# Patient Record
Sex: Male | Born: 1945 | Race: White | Hispanic: No | Marital: Married
Health system: Midwestern US, Community
[De-identification: ages and names within clinical notes are randomized; demographics above are authoritative.]

## PROBLEM LIST (undated history)

## (undated) DIAGNOSIS — I10 Essential (primary) hypertension: Secondary | ICD-10-CM

## (undated) DIAGNOSIS — E7800 Pure hypercholesterolemia, unspecified: Secondary | ICD-10-CM

## (undated) DIAGNOSIS — S43422A Sprain of left rotator cuff capsule, initial encounter: Secondary | ICD-10-CM

## (undated) DIAGNOSIS — R7303 Prediabetes: Secondary | ICD-10-CM

## (undated) DIAGNOSIS — R002 Palpitations: Secondary | ICD-10-CM

## (undated) DIAGNOSIS — Z125 Encounter for screening for malignant neoplasm of prostate: Secondary | ICD-10-CM

---

## 2008-11-10 LAB — LIPID PANEL
Cholesterol, Total: 175 mg/dl (ref <200)
HDL: 37 mg/dL — ABNORMAL LOW (ref 40–60)
LDL Calculated: 113 mg/dl — ABNORMAL HIGH (ref <100)
Triglycerides: 124 mg/dl (ref <150)
VLDL Cholesterol Calculated: 25 mg/dl

## 2008-11-10 LAB — CBC WITH AUTO DIFFERENTIAL
Basophils %: 0.5 % (ref 0.0–2.0)
Basophils Absolute: 0 10*3 (ref 0.0–0.2)
Eosinophils %: 4.8 % (ref 0.0–5.0)
Eosinophils Absolute: 0.3 10*3 (ref 0.0–0.6)
Granulocyte Absolute Count: 3.2 10*3 (ref 1.7–7.7)
Hematocrit: 44.9 % (ref 40.5–52.5)
Hemoglobin: 15 g/dL (ref 13.5–17.5)
Lymphocytes %: 30.2 % (ref 25.0–40.0)
Lymphocytes Absolute: 1.8 10*3 (ref 1.0–5.1)
MCH: 33.2 pg (ref 26–34)
MCHC: 33.4 g/dL (ref 31–36)
MCV: 99.4 fl (ref 80–100)
MPV: 8.4 fl (ref 5.0–10.5)
Monocytes %: 9.3 %
Monocytes Absolute: 0.5 10*3
Platelets: 221 10*3 (ref 135–450)
RBC: 4.52 10*6 (ref 4.2–5.9)
RDW: 13.2 % (ref 11.5–14.5)
Segs Relative: 55.2 % (ref 42.0–63.0)
WBC: 5.8 10*3 (ref 4.0–11.0)

## 2008-11-10 LAB — PSA SCREENING: PSA: 1.39 ng/ml

## 2008-11-10 LAB — RENAL FUNCTION PANEL
Albumin: 4.3 g/dL (ref 3.4–5.0)
BUN: 14 mg/dL (ref 7–18)
CO2: 26 meq/L (ref 21–32)
Calcium: 9.2 mg/dL (ref 8.3–10.6)
Chloride: 105 meq/L (ref 99–110)
Creatinine: 1 mg/dL (ref 0.8–1.3)
GFR Est, African/Amer: >60
GFR, Estimated: >60 (ref >60)
Glucose: 93 mg/dL (ref 70–99)
Phosphorus: 4.1 mg/dL (ref 2.5–4.9)
Potassium: 4.2 meq/L (ref 3.5–5.1)
Sodium: 142 meq/L (ref 136–145)

## 2008-11-10 LAB — AST: AST: 32 U/L (ref 15–37)

## 2008-12-07 MED ORDER — VARDENAFIL HCL 20 MG PO TABS
20 | ORAL_TABLET | ORAL | Status: DC | PRN
Start: 2008-12-07 — End: 2009-12-20

## 2008-12-07 NOTE — Progress Notes (Addendum)
Addended by: Lurlean Leyden on: 12/07/2008      Modules accepted: Orders

## 2008-12-07 NOTE — Progress Notes (Signed)
Subjective:      Patient ID: Brandon Powell is a 63 y.o. male.    HPI The patient is here for comprehensive evaluation of ongoing medical problems. The patient has been instructed in the benefits of a healthy diet, regular exercise and ideal body weight.   The patient is here for ongoing evaluation and treatment of hypertension. The patient has been instructed in the desirability regular exercise, ideal body weight and medication compliance. The patient has been advised to perform regular home blood pressure monitoring and to bring the results to every office visit.   The patient is here for evaluation of dyslipidemia. The patient has been instructed in diet and exercise.     Review of Systems   Constitutional: Negative.  Negative for appetite change, fatigue and unexpected weight change.   HENT: Negative.  Negative for hearing loss, ear pain, congestion, trouble swallowing, neck pain, neck stiffness and tinnitus.         "My palate and deviated septum" has been treated by the [facility] with an allergy pill (? Name) and a spray (?name). He still uses the pill.   Eyes: Negative.  Negative for visual disturbance.   Respiratory: Negative.  Negative for cough, chest tightness, shortness of breath and wheezing.    Cardiovascular: Negative.  Negative for chest pain, palpitations and leg swelling.   Gastrointestinal: Negative.  Negative for nausea, abdominal pain, diarrhea, constipation, blood in stool, anal bleeding and rectal pain.        Recurrent symptoms of dysphagia. He has a known Schatzki's ring per Dr. Roselle Locus in the past.   Genitourinary: Negative.  Negative for dysuria, urgency, frequency, hematuria and difficulty urinating.   Musculoskeletal: Positive for arthralgias. Negative for myalgias, back pain, joint swelling and gait problem.        Shoulders still hurt, he is not yet ready to do anything surgical.   Skin: Negative.  Negative for rash, color change and pallor.   Neurological: Negative.  Negative for  dizziness, syncope, speech difficulty, weakness, light-headedness, numbness and headaches.   Hematological: Negative.  Does not bruise/bleed easily.   Psychiatric/Behavioral: Negative.  Negative for suicidal ideas, confusion, sleep disturbance, self-injury, dysphoric mood and decreased concentration. The patient is not nervous/anxious and is not hyperactive.    All other systems reviewed and are negative.        Objective:   Physical Exam   Nursing note and vitals reviewed.  Constitutional: He is oriented to person, place, and time. He appears well-developed and well-nourished. No distress.   HENT:   Head: Normocephalic and atraumatic.   Right Ear: External ear normal.   Left Ear: External ear normal.   Nose: Nose normal.   Mouth/Throat: Oropharynx is clear and moist. No oropharyngeal exudate.   Eyes: Conjunctivae and extraocular motions are normal. Pupils are equal, round, and reactive to light. Right eye exhibits no discharge. Left eye exhibits no discharge. No scleral icterus.   Neck: Normal range of motion. Neck supple. No JVD present. No tracheal deviation present. No thyromegaly present.   Cardiovascular: Regular rhythm and intact distal pulses.  Exam reveals no gallop and no friction rub.    No murmur heard.       No carotid bruits.   Pulmonary/Chest: Effort normal and breath sounds normal. No stridor. No respiratory distress. He has no wheezes. He has no rales. He exhibits no tenderness.   Abdominal: Soft. Bowel sounds are normal. He exhibits no distension and no mass. No tenderness. He has  no rebound and no guarding.   Genitourinary: Rectum normal, prostate normal and penis normal. Guaiac negative stool.   Musculoskeletal: Normal range of motion. He exhibits no edema and no tenderness.   Lymphadenopathy:     He has no cervical adenopathy.   Neurological: He is alert and oriented to person, place, and time. No cranial nerve deficit. Coordination normal.   Skin: Skin is warm and dry. No rash noted. He is not  diaphoretic. No erythema. No pallor.   Psychiatric: He has a normal mood and affect. His behavior is normal. Judgment and thought content normal.       Assessment:      Patient Active Problem List   Diagnoses Date Noted   . Prediabetes [790.48F] 11/15/2008   . Dysphagia [787.20S] 11/15/2008   . ED (erectile dysfunction) [607.84R] 11/15/2008   . GERD (gastroesophageal reflux disease) [530.81S]    . HYPERTENSION [401.9AJ]    . HYPERCHOLESTEROLEMIA [272.0L]         Stable except for increasing dysphagia. No sign of pre-diabetes on this exam.      Plan:      See Dr. Roselle Locus [phone] for an esophageal dilitation for your Schatzki's ring.  Take Ciprofloxacin 500mg  twice a day for at least 5 days for diarrhea.  Ret in 6 mos for fasting blood only.

## 2008-12-07 NOTE — Patient Instructions (Signed)
See Dr. Roselle Locus [phone] for an esophageal dilitation for your Schatzki's ring.  Take Ciprofloxacin 500mg  twice a day for at least 5 days for diarrhea.

## 2009-06-06 ENCOUNTER — Encounter

## 2009-06-07 LAB — LIPID PANEL
Cholesterol, Total: 182 mg/dl (ref <200)
HDL: 35 mg/dl — ABNORMAL LOW (ref 40–60)
LDL Direct: 130 mg/dl — ABNORMAL HIGH (ref <100)
Triglycerides: 308 mg/dl — ABNORMAL HIGH (ref <150)

## 2009-06-07 LAB — BASIC METABOLIC PANEL
BUN: 17 mg/dl (ref 7–18)
CO2: 29 mEq/L (ref 21–32)
Calcium: 9.2 mg/dl (ref 8.3–10.6)
Chloride: 102 mEq/L (ref 99–110)
Creatinine: 0.9 mg/dl (ref 0.8–1.3)
GFR Est, African/Amer: >60
GFR, Estimated: >60 (ref >60)
Glucose: 94 mg/dl (ref 70–99)
Potassium: 4 mEq/L (ref 3.5–5.1)
Sodium: 138 mEq/L (ref 136–145)

## 2009-06-07 LAB — CBC WITH AUTO DIFFERENTIAL
Basophils %: 0.3 % (ref 0.0–2.0)
Basophils Absolute: 0 10*3 (ref 0.0–0.2)
Eosinophils %: 4.5 % (ref 0.0–5.0)
Eosinophils Absolute: 0.3 10*3 (ref 0.0–0.6)
Granulocyte Absolute Count: 3.8 10*3 (ref 1.7–7.7)
Hematocrit: 42 % (ref 40.5–52.5)
Hemoglobin: 14.1 gm/dl (ref 13.5–17.5)
Lymphocytes %: 33.2 % (ref 25.0–40.0)
Lymphocytes Absolute: 2.5 10*3 (ref 1.0–5.1)
MCH: 33.5 pg (ref 26–34)
MCHC: 33.6 gm/dl (ref 31–36)
MCV: 99.6 fl (ref 80–100)
MPV: 8.3 fl (ref 5.0–10.5)
Monocytes %: 10.4 % (ref 0.0–12.0)
Monocytes Absolute: 0.8 10*3 (ref 0.0–1.3)
Platelets: 226 10*3 (ref 135–450)
RBC: 4.22 10*6 (ref 4.2–5.9)
RDW: 12.5 % (ref 11.5–14.5)
Segs Relative: 51.6 % (ref 42.0–63.0)
WBC: 7.4 10*3 (ref 4.0–11.0)

## 2009-06-07 LAB — AST: AST: 25 U/L (ref 15–37)

## 2009-06-07 LAB — TSH: TSH: 4.06 u[IU]/mL (ref 0.35–5.5)

## 2009-06-08 LAB — HEMOGLOBIN A1C
A1c: 5.6 (ref 4.0–6.0)
eAG: 114 mg/dl

## 2009-06-10 NOTE — Telephone Encounter (Signed)
Discussed his fasting labs. Lipids not as good - he has been traveling for work, extensively, [place] of [place], poor diet due to this. He is back home now. He wants to move up his CPE to September, fine with me. Will recheck labs then.

## 2009-09-27 ENCOUNTER — Encounter

## 2009-09-30 LAB — CBC WITH AUTO DIFFERENTIAL
Basophils %: 0.4 % (ref 0.0–2.0)
Basophils Absolute: 0 10*3 (ref 0.0–0.2)
Eosinophils %: 5.5 % — ABNORMAL HIGH (ref 0.0–5.0)
Eosinophils Absolute: 0.3 10*3 (ref 0.0–0.6)
Granulocyte Absolute Count: 2.4 10*3 (ref 1.7–7.7)
Hematocrit: 41.5 % (ref 40.5–52.5)
Hemoglobin: 14.4 gm/dl (ref 13.5–17.5)
Lymphocytes %: 39.6 % (ref 25.0–40.0)
Lymphocytes Absolute: 2.2 10*3 (ref 1.0–5.1)
MCH: 34.2 pg — ABNORMAL HIGH (ref 26–34)
MCHC: 34.9 gm/dl (ref 31–36)
MCV: 98 fl (ref 80–100)
MPV: 8.5 fl (ref 5.0–10.5)
Monocytes %: 11.2 % (ref 0.0–12.0)
Monocytes Absolute: 0.6 10*3 (ref 0.0–1.3)
Platelets: 221 10*3 (ref 135–450)
RBC: 4.23 10*6 (ref 4.2–5.9)
RDW: 12.9 % (ref 11.5–14.5)
Segs Relative: 43.3 % (ref 42.0–63.0)
WBC: 5.6 10*3 (ref 4.0–11.0)

## 2009-09-30 LAB — LIPID PANEL
Cholesterol, Total: 160 mg/dl (ref <200)
HDL: 37 mg/dl — ABNORMAL LOW (ref 40–60)
LDL Calculated: 88 mg/dl (ref <100)
Triglycerides: 175 mg/dl — ABNORMAL HIGH (ref <150)
VLDL Cholesterol Calculated: 35 mg/dl

## 2009-09-30 LAB — TSH: TSH: 3.13 u[IU]/mL (ref 0.35–5.5)

## 2009-09-30 LAB — BASIC METABOLIC PANEL
BUN: 12 mg/dl (ref 7–18)
CO2: 31 mEq/L (ref 21–32)
Calcium: 9.3 mg/dl (ref 8.3–10.6)
Chloride: 105 mEq/L (ref 99–110)
Creatinine: 0.8 mg/dl (ref 0.8–1.3)
GFR Est, African/Amer: >60
GFR, Estimated: >60 (ref >60)
Glucose: 90 mg/dl (ref 70–99)
Potassium: 4.7 mEq/L (ref 3.5–5.1)
Sodium: 141 mEq/L (ref 136–145)

## 2009-09-30 LAB — AST: AST: 23 U/L (ref 15–37)

## 2009-10-01 LAB — PSA SCREENING: PSA: 1.09 ng/ml

## 2009-11-01 NOTE — Progress Notes (Signed)
Addended by: Lurlean Leyden on: 11/01/2009 09:41 AM     Modules accepted: Orders

## 2009-11-01 NOTE — Patient Instructions (Signed)
See the dermatologist, Dr. Daryll Drown, for various skin lesions.  See Dr. Roselle Locus for the swallowing problems, [phone].  Continue current medications.   Consider Weight Watchers or Doylene Bode diets. They have on-line versions.  Have a fasting blood test in 6 months and drop off your BP list then.  Have a yearly physical.

## 2009-11-01 NOTE — Progress Notes (Signed)
Subjective:      Patient ID: Brandon Powell is a 64 y.o. male.    HPI The patient is here for comprehensive evaluation of ongoing medical problems. The patient has been instructed in the benefits of a healthy diet, regular exercise and ideal body weight.  The patient is here for ongoing evaluation and treatment of hypertension. The patient has been instructed in the desirability regular exercise, ideal body weight and medication compliance. The patient has been advised to perform regular home blood pressure monitoring and to bring the results to every office visit. He checks at home "110/65"  Current Outpatient Prescriptions   Medication Sig Dispense Refill   ??? simvastatin (ZOCOR) 10 MG tablet Take 10 mg by mouth nightly.         ??? DiphenhydrAMINE HCl (ALLERGY MED PO) Take  by mouth.         ??? vardenafil (LEVITRA) 20 MG tablet Take 0.5-1 tablets by mouth as needed.  10 tablet  10         Review of Systems   Constitutional: Negative.  Negative for fever, chills, appetite change and unexpected weight change.   HENT: Negative for hearing loss and tinnitus.         [Old noise exposure, no change.  Respiratory: Negative.  Negative for cough, chest tightness, shortness of breath and wheezing.    Cardiovascular: Negative.  Negative for chest pain, palpitations and leg swelling.   Gastrointestinal: Negative.  Negative for abdominal pain, diarrhea, constipation and blood in stool.        [He continues to have dysphagia, has known Schatzki's ring, plans to call Roselle Locus but has not done so yet. Has been very busy with travel.  Neurological: Negative for dizziness, syncope, light-headedness and headaches.       Objective:   Physical Exam   [nursing notereviewed.  Constitutional: He is oriented to person, place, and time. He appears well-developed and well-nourished. No distress.   HENT:   Head: Normocephalic and atraumatic.   Right Ear: External ear normal.   Left Ear: External ear normal.   Nose: Nose normal.    Mouth/Throat: Oropharynx is clear and moist. No oropharyngeal exudate.   Eyes: Conjunctivae and EOM are normal. Pupils are equal, round, and reactive to light. Right eye exhibits no discharge. Left eye exhibits no discharge. No scleral icterus.   Neck: Normal range of motion. Neck supple. No JVD present. No tracheal deviation present. No thyromegaly present.   Cardiovascular: Regular rhythm and intact distal pulses.  Exam reveals no gallop and no friction rub.    No murmur heard.       No carotid bruits.   Pulmonary/Chest: Effort normal and breath sounds normal. No stridor. No respiratory distress. He has no wheezes. He has no rales. He exhibits no tenderness.   Abdominal: Soft. Bowel sounds are normal. He exhibits no distension and no mass. No tenderness. He has no rebound and no guarding.   Genitourinary: Rectum normal, prostate normal and penis normal. Guaiac negative stool.        Testicles normal.   Musculoskeletal: Normal range of motion. He exhibits no edema and no tenderness.   Lymphadenopathy:     He has no cervical adenopathy.   Neurological: He is alert and oriented to person, place, and time. No cranial nerve deficit. Coordination normal.   Skin: Skin is warm and dry. No rash noted. He is not diaphoretic. No erythema. No pallor.        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 His behavior is normal. Judgment and thought content normal.     BP 122/74   Pulse 60   Temp(Src) 97.8 ??F (36.6 ??C) (Oral)   Ht 5' 9.75" (1.772 m)   Wt 212 lb 9.6 oz (96.435 kg)   BMI 30.72 kg/m2   Assessment:      Patient Active Problem List   Diagnoses Date Noted   ??? Skin lesions, generalized 11/01/2009   ??? Prediabetes 11/15/2008   ??? Dysphagia 11/15/2008   ??? ED (erectile dysfunction) 11/15/2008   ??? GERD (gastroesophageal reflux disease)    ??? HYPERTENSION    ??? HYPERCHOLESTEROLEMIA      BP control is  good. Needs to lose weight. Dysphagia needs to be addressed. Skin lesions need to be checked and treated.      Plan:         See the dermatologist, Dr. Daryll Drown, for various skin lesions.  See Dr. Roselle Locus for the swallowing problems, [phone].  Continue current medications.   Consider Weight Watchers or Doylene Bode diets. They have on-line versions.  Have a fasting blood test in 6 months and drop off your BP list then.  Have a yearly physical.

## 2009-12-24 NOTE — Telephone Encounter (Signed)
PT HAS AN APPT WITH DR Roselle LocusSAEED ON 12.8.11 * PT NEEDS A REFERRAL,HE HAS [facility]

## 2009-12-24 NOTE — Telephone Encounter (Signed)
[employer] referral submitted.

## 2010-01-16 NOTE — Telephone Encounter (Signed)
THE PT SAYS DR.PAYNE REFFERED HIM TO GO SEE DR.SAEED FOR AN ENDOSCOPY WITH A BALLOON-- THE PT SAYS HE HAD THE ENDOSCOPY PROCEDURE (NO BALLOON) AND HE IS HAVING THE SAME PROBLEM STILL AND HE HAS BEEN REFERRED TO A NEW DOCTOR AND THEY ARE TELLING HIM HE HAS TO HAVE THE SAME PROCEDURE DONE AGAIN-- HE HAS AN APPT WITH DR.PAYNE ON January 3RD @ 8:40AM TO DISCUSS WHAT TO DO NOW- HE WANTED ME TO WRITE A PHONE NOTE TO DR.PAYNE SO HE COULD READ THE NOTE WHEN HE COMES BACK SO HE WAS AWARE OF WHAT WAS GOING ON BEFORE THE PT COMES IN--

## 2010-01-16 NOTE — Telephone Encounter (Signed)
Judi Cong - please see that we have all records from Dr. Roselle Locus prior to the patients visit with me.

## 2010-01-16 NOTE — Telephone Encounter (Signed)
Spoke with Asher Muir at [place] office and she will get the EGD sent over.

## 2010-01-21 NOTE — Progress Notes (Signed)
Subjective:      Patient ID: Brandon Powell is a 65 y.o. male.    HPI He is here to discuss his recent EGD. He had an EGD by Dr. Roselle Locus, for dysphagia, that showed a Schatzki's ring which saeed disrupted with forceps. He also did biopsies of suspected esophagitis. The biopsies showed moderate acute and chronic gastroesophagitis with reactive atypia but no dysplasia, metaplasia or malignancy.  He is unhappy that Dr. Roselle Locus did not do a balloon procedure as he says Saeed had stated that he would do, did not call him with the results and has since left his practice. He says that he is still having the same dysphagia. We also re-discussed his HTN which he is treating without medication - he seems very vague in his recollection about this.    Review of Systems   Skin:        I previously recommended that he see a derm, Dr. Daryll Drown, for skin lesions c/w AK.  He has an appointment this week.       Objective:   Physical Exam   Constitutional: He appears well-developed and well-nourished. No distress.   Psychiatric: He has a normal mood and affect. His behavior is normal. Judgment and thought content normal.       Assessment:      Continued dysphagia.      Plan:          See Dr. Roselle Locus or one of the other doctors in his group. Return for yearly complete exam as previously discussed. He has an apptmt with Dr. Augusto Gamble but he may go back to Dr. Roselle Locus with his new group, instead.

## 2010-01-21 NOTE — Patient Instructions (Signed)
See Dr. Roselle Locus or one of the other doctors in his group. Return for yearly complete exam as previously discussed.

## 2010-02-18 NOTE — Telephone Encounter (Signed)
Per Dr Suzie Portela, pt needs to call Dr Einar Crow office.    Pt notified

## 2010-02-18 NOTE — Telephone Encounter (Signed)
Pt would like to discuss the medicine that the Dr Augusto Gamble prescribed for him.  Since starting it he has had diarrhea.  Please call.

## 2010-03-06 NOTE — Telephone Encounter (Signed)
[employer] referral has been submitted for the backdate of 01/24/2010.

## 2010-03-06 NOTE — Telephone Encounter (Signed)
NEEDS [employer] REFERRAL TO DR [facility] SKIN LESIONS.  HE WAS SEEN 01/24/10

## 2010-04-25 NOTE — Telephone Encounter (Signed)
Spoke with patient. Found out he is in [employer] office, being seen for Blurred vision and flashes in his left eye.  Not sure whether he would require a [employer] referral but will place one through the website.  Patient aware.  Referral placed.

## 2010-04-25 NOTE — Telephone Encounter (Signed)
The pt is on his way to the opthamologist to have his left eye checked out-- The pt wanted to know if he needs a referral for this doctor or not--

## 2010-05-02 ENCOUNTER — Encounter

## 2010-05-05 LAB — CBC WITH AUTO DIFFERENTIAL
Basophils %: 0.3 % (ref 0.0–2.0)
Basophils Absolute: 0 10*3 (ref 0.0–0.2)
Eosinophils %: 5.5 % — ABNORMAL HIGH (ref 0.0–5.0)
Eosinophils Absolute: 0.3 10*3 (ref 0.0–0.6)
Granulocyte Absolute Count: 2.5 10*3 (ref 1.7–7.7)
Hematocrit: 42.7 % (ref 40.5–52.5)
Hemoglobin: 14.5 gm/dl (ref 13.5–17.5)
Lymphocytes %: 38 % (ref 25.0–40.0)
Lymphocytes Absolute: 2 10*3 (ref 1.0–5.1)
MCH: 33.1 pg (ref 26–34)
MCHC: 33.8 gm/dl (ref 31–36)
MCV: 97.8 fl (ref 80–100)
MPV: 8.4 fl (ref 5.0–10.5)
Monocytes %: 10 % (ref 0.0–12.0)
Monocytes Absolute: 0.5 10*3 (ref 0.0–1.3)
Platelets: 203 10*3 (ref 135–450)
RBC: 4.37 10*6 (ref 4.2–5.9)
RDW: 13.2 % (ref 11.5–14.5)
Segs Relative: 46.2 % (ref 42.0–63.0)
WBC: 5.4 10*3 (ref 4.0–11.0)

## 2010-05-05 LAB — BASIC METABOLIC PANEL
BUN: 10 mg/dl (ref 7–18)
CO2: 30 mEq/L (ref 21–32)
Calcium: 8.8 mg/dl (ref 8.3–10.6)
Chloride: 103 mEq/L (ref 99–110)
Creatinine: 1 mg/dl (ref 0.8–1.3)
GFR Est, African/Amer: >60
GFR, Estimated: >60 (ref >60)
Glucose: 93 mg/dl (ref 70–99)
Potassium: 4.6 mEq/L (ref 3.5–5.1)
Sodium: 142 mEq/L (ref 136–145)

## 2010-05-05 LAB — LIPID PANEL
Cholesterol, Total: 188 mg/dl (ref <200)
HDL: 37 mg/dl — ABNORMAL LOW (ref 40–60)
LDL Calculated: 108 mg/dl — ABNORMAL HIGH (ref <100)
Triglycerides: 211 mg/dl — ABNORMAL HIGH (ref <150)
VLDL Cholesterol Calculated: 42 mg/dl

## 2010-05-05 LAB — AST: AST: 27 U/L (ref 15–37)

## 2010-05-05 LAB — TSH: TSH: 2.3 u[IU]/mL (ref 0.35–5.5)

## 2010-05-06 NOTE — Progress Notes (Signed)
Quick Note:    Discussed his labs; continue same meds; see me in sept for CPE.  ______

## 2010-09-02 NOTE — Telephone Encounter (Signed)
Referral placed with [employer]. Awaiting a response  Brandon Powell is aware.

## 2010-09-02 NOTE — Telephone Encounter (Signed)
KAREN FROM DR [facility] OFFICE CALLING---SHE SAYS PT NEEDS A [employer] REFERRAL FOR DOS 07/16/10 FOR SECOND OPINION FOR SLEEP APNEA, SNORING, DEVIATED SEPTUM.  PT TOLD KAREN I HAD TAKEN CARE OF REFERRAL---I KNEW NOTHING ABOUT IT.  KAREN SAYS NOW DR Vanice Sarah WANTS TO DO SURGERY IS HOW SHE FOUND OUT THERE WAS NO [employer] REFERRAL BECAUSE HER REQUEST FOR AUTHORIZATION FOR SURGERY WAS DENIED FOR LACK OF REFERRAL

## 2010-09-09 NOTE — Telephone Encounter (Signed)
Background info: [employer] did not approve the referral to [facility] and in fact switched the referral to a Dr. Tommie Ard.  09/05/10 Mr. Leinweber came in with a bill from [facility] and wonders what is going on. I told them about [facility] and the [employer] does not back date and the he would be responsible for $300 deductible and 50% of medical care. After a lengthy discussion with Mr. Etsitty about the referral he left and is going to consider Dr. Tommie Ard.    Today Raynelle Fanning, nurse of outpatient surgery at [facility] called. She has a surgical request from [facility]. I explained the events that we have gone through. She is going to call Vanice Sarah and find out if surgery has been cancelled. She is also going to call [facility] to see if Mr. Dendinger can be seen.

## 2010-09-25 ENCOUNTER — Encounter

## 2010-09-26 NOTE — Telephone Encounter (Signed)
Noted.

## 2010-09-26 NOTE — Telephone Encounter (Signed)
I CALLED PT THIS MORNING TO REMIND HIM OF MON'S APPT----HE MENTIONED THAT AT HIS BLOOD DRAW THIS MORNING HE MENTIONED TO THE ONE DRAWING THAT 'IT HURT'.  HE SAYS HIS ARM IS HURTING AND RED AND 'PUFFING UP' AT THE DRAW SITE.  HE SAYS HE PUT ICE ON IT AND KNOWS WHAT TO DO FROM BEING IN THE MILITARY.  HE SAYS HE IS OK

## 2010-09-27 LAB — CBC WITH AUTO DIFFERENTIAL
Basophils %: 0.4 % (ref 0.0–2.0)
Basophils Absolute: 0 10*3 (ref 0.0–0.2)
Eosinophils %: 4.5 % (ref 0.0–5.0)
Eosinophils Absolute: 0.3 10*3 (ref 0.0–0.6)
Granulocyte Absolute Count: 4.1 10*3 (ref 1.7–7.7)
Hematocrit: 43.4 % (ref 40.5–52.5)
Hemoglobin: 15.1 gm/dl (ref 13.5–17.5)
Lymphocytes %: 27.8 % (ref 25.0–40.0)
Lymphocytes Absolute: 2 10*3 (ref 1.0–5.1)
MCH: 34.1 pg — ABNORMAL HIGH (ref 26–34)
MCHC: 34.7 gm/dl (ref 31–36)
MCV: 98.5 fl (ref 80–100)
MPV: 8.6 fl (ref 5.0–10.5)
Monocytes %: 9.8 % (ref 0.0–12.0)
Monocytes Absolute: 0.7 10*3 (ref 0.0–1.3)
Platelets: 199 10*3 (ref 135–450)
RBC: 4.41 10*6 (ref 4.2–5.9)
RDW: 13.5 % (ref 11.5–14.5)
Segs Relative: 57.5 % (ref 42.0–63.0)
WBC: 7.2 10*3 (ref 4.0–11.0)

## 2010-09-27 LAB — BASIC METABOLIC PANEL
BUN: 12 mg/dl (ref 7–18)
CO2: 32 mEq/L (ref 21–32)
Calcium: 9.3 mg/dl (ref 8.3–10.6)
Chloride: 104 mEq/L (ref 99–110)
Creatinine: 0.9 mg/dl (ref 0.8–1.3)
GFR Est, African/Amer: >60
GFR, Estimated: >60 (ref >60)
Glucose: 85 mg/dl (ref 70–99)
Potassium: 4.7 mEq/L (ref 3.5–5.1)
Sodium: 144 mEq/L (ref 136–145)

## 2010-09-27 LAB — HEMOGLOBIN A1C
Hemoglobin A1C: 5.6 (ref 4.0–6.0)
eAG: 114 mg/dl

## 2010-09-27 LAB — AST: AST: 21 U/L (ref 15–37)

## 2010-09-27 LAB — LIPID PANEL
Cholesterol, Total: 178 mg/dl (ref <200)
HDL: 34 mg/dl — ABNORMAL LOW (ref 40–60)
LDL Calculated: 109 mg/dl — ABNORMAL HIGH (ref <100)
Triglycerides: 175 mg/dl — ABNORMAL HIGH (ref <150)
VLDL Cholesterol Calculated: 35 mg/dl

## 2010-09-27 LAB — PSA SCREENING: PSA: 1.64 ng/ml

## 2010-09-27 LAB — TSH: TSH: 2.99 u[IU]/mL (ref 0.35–5.5)

## 2010-09-29 NOTE — Patient Instructions (Signed)
Continue weight loss diet.  Continue home BP checks.  Check vaccine records - if you have not had pneumococcal vaccine, I recommend it.  See me in 6 months for a fasting blood test and office visit.

## 2010-09-29 NOTE — Progress Notes (Signed)
Subjective:      Patient ID: Brandon Powell is a 65 y.o. male.    HPI The patient is here for comprehensive evaluation of ongoing medical problems. The patient has been instructed in the benefits of a healthy diet, regular exercise and ideal body weight.  Patient Active Problem List   Diagnoses Date Noted   . OSA (obstructive sleep apnea) 07/28/2010   . Skin lesions, generalized 11/01/2009   . Prediabetes 11/15/2008   . Dysphagia 11/15/2008   . ED (erectile dysfunction) 11/15/2008   . GERD (gastroesophageal reflux disease)    . HYPERTENSION    . HYPERCHOLESTEROLEMIA      The patient is here for ongoing evaluation and treatment of hypertension. The patient has been instructed in the desirability regular exercise, ideal body weight and medication compliance. The patient has been advised to perform regular home blood pressure monitoring and to bring the results to every office visit. Has a good BP list, avg ~ 124/65  The patient is here for evaluation of dyslipidemia. The patient has been instructed in diet and exercise.        Review of Systems   Constitutional: Negative for appetite change and unexpected weight change.   HENT: Positive for hearing loss.         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  He is still considering nasal surgery Vanice Sarah) but plans to wait until next spring, at least.   Respiratory: Negative for cough, chest tightness and shortness of breath.         Has a current URI but getting better.   Cardiovascular: Positive for leg swelling. Negative for chest pain and palpitations.        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   Gastrointestinal: Negative for abdominal pain.        He had a dilitation of the Schatzkis ring but it recurred. He is back to his usual of having to take small bites.   Neurological: Negative for syncope and light-headedness.       Objective:   Physical Exam   Vitals reviewed.  Constitutional: He is oriented  to person, place, and time. He appears well-developed and well-nourished. No distress.   HENT:   Head: Normocephalic and atraumatic.   Right Ear: External ear normal.   Left Ear: External ear normal.   Nose: Nose normal.   Mouth/Throat: Oropharynx is clear and moist. No oropharyngeal exudate.   Eyes: Conjunctivae and EOM are normal. Pupils are equal, round, and reactive to light. No scleral icterus.   Neck: Normal range of motion. Neck supple. No thyromegaly present.   Cardiovascular: Normal rate, regular rhythm, normal heart sounds and intact distal pulses.  Exam reveals no gallop and no friction rub.    No murmur heard.       No carotid bruits.   Pulmonary/Chest: Effort normal and breath sounds normal. No respiratory distress. He has no wheezes. He has no rales.   Abdominal: Soft. Bowel sounds are normal. He exhibits no distension and no mass. There is no tenderness. There is no guarding.   Genitourinary: Rectum normal, prostate normal and penis normal. Guaiac negative stool.   Musculoskeletal: Normal range of motion. He exhibits no edema and no tenderness.   Lymphadenopathy:     He has no cervical adenopathy.   Neurological: He is alert and oriented to person, place, and time. He exhibits normal muscle tone.  Coordination normal.   Skin: Skin is warm and dry. No rash noted. No erythema.   Psychiatric: He has a normal mood and affect. His behavior is normal. Judgment and thought content normal.       Assessment:      Patient Active Problem List   Diagnoses Date Noted   . OSA (obstructive sleep apnea) 07/28/2010   . Skin lesions, generalized 11/01/2009   . Prediabetes 11/15/2008   . Dysphagia 11/15/2008   . ED (erectile dysfunction) 11/15/2008   . GERD (gastroesophageal reflux disease)    . HYPERTENSION    . HYPERCHOLESTEROLEMIA            Plan:      Patient Instructions   Continue weight loss diet.  Continue home BP checks.  Check vaccine records - if you have not had pneumococcal vaccine, I recommend it.  See me  in 6 months for a fasting blood test and office visit.      Forty minutes face-to-face spent with patient, greater than 50% counseling and co-ordination of care.

## 2010-10-23 NOTE — Progress Notes (Signed)
Received and reviewed

## 2010-10-29 NOTE — Telephone Encounter (Signed)
[facility] [phone]

## 2010-10-29 NOTE — Telephone Encounter (Signed)
PT STOPPED IN THE OFFICE TODAY--NEEDS REFILLS ON SIMVASTATIN AND LEVITRA [facility]ED [facility] TO [facility]   THE SIMVASTATIN HE ONLY TAKES 20MG  QD BUT HAS BEEN GETTING 40MG  TABLETS TO CUT IN HALF---IS IT POSSIBLE TO GET 20MG  TABLETS ???

## 2010-10-30 NOTE — Telephone Encounter (Signed)
Is this okay to do?    Do not try to e-scribe I have to call it in.

## 2010-10-30 NOTE — Telephone Encounter (Signed)
Cannot get through to the [facility] pharmacy. 15 minutes on hold one time 10 minutes one other time.  Left message for Brandon Powell to call back to see if we can print these out and he can take them to them.

## 2010-10-30 NOTE — Telephone Encounter (Signed)
Yes, those are fine to refill and Rx 20 mg tabs of Simva

## 2010-10-31 MED ORDER — VARDENAFIL HCL 20 MG PO TABS
20 | ORAL_TABLET | ORAL | Status: DC | PRN
Start: 2010-10-31 — End: 2011-03-30

## 2010-10-31 NOTE — Telephone Encounter (Signed)
Spoke to patient he will pick up scripts on Monday.   Rxs placed on Payne's desk for signature

## 2010-11-04 NOTE — Telephone Encounter (Signed)
PT CALLING BECAUSE HE WOULD LIKE AN ORDER FOR A SHINGLES SHOT SENT TO [facility] [phone]    HE ALSO MENTIONED YESTERDAY THE RX FOR LEVITRA--"THEY" ([facility] [employer]?) WOULD ONLY AUTHORIZE 6 PILLS. HE WILL SEE HOW THAT WORKS OUT AS REFILLS OVER THE NEXT 5 MONTHS AND THE NUMBER [phone]

## 2010-11-04 NOTE — Telephone Encounter (Signed)
Shingles Rx printed and will be faxed.

## 2010-12-02 NOTE — Progress Notes (Signed)
Addended by: Lurlean Leyden on: 12/02/2010 11:26 AM     Modules accepted: Orders

## 2010-12-02 NOTE — Patient Instructions (Signed)
Zpak (Azithromycin) Take 2 today then 1 daily until all gone; take with food.  OK to use Aleve Cold & Sinus as needed.  We will call with throat culture results.    Call anytime if worse.

## 2010-12-02 NOTE — Progress Notes (Signed)
Subjective:      Patient ID: Brandon Powell is a 65 y.o. male.    HPI Started yesterday, headache then sore throat and congestion, yellow mucous drainage. He took some OTC zinc supplement "Airborne" but it didn't help.   He is run-down from recent travel.     Review of Systems   Constitutional: Positive for chills.   HENT: Positive for congestion and rhinorrhea. Negative for ear pain and neck pain.    Respiratory: Negative for cough.        Objective:   Physical Exam   HENT:   Mouth/Throat: No oropharyngeal exudate.        Throat red, no exudate. Mildly tender cerv nodes, not enlarged.   Pulmonary/Chest: Effort normal and breath sounds normal. No respiratory distress. He has no wheezes. He has no rales.       Assessment:      URI, can't R/O strep.      Plan:      TC send-out today.  Z-pak  He plans to d/c his simva for 5 days (I suggested 1/2 tab daily) then resume.

## 2010-12-04 NOTE — Telephone Encounter (Signed)
Patient informed.  See the result note on the throat culture.

## 2010-12-04 NOTE — Telephone Encounter (Signed)
The pt wanted to know if the throat culture he had done yesterday have come back yet.

## 2010-12-04 NOTE — Progress Notes (Signed)
Quick Note:    Please inform him that TC is neg for strep. However, if he feels somewhat improved on the Z-pak, I suggest he continue it, since it also kills quite a few germs other than strep.  ______

## 2011-02-02 NOTE — Telephone Encounter (Signed)
PT CALLING FOR [employer] REFERRAL TO DR TIFFANY PICKUP--[facility].   HE SAYS HE HAS APPT 02/20/11 FOR ANNUAL SKIN CHECK.  HE WOULD LIKE A COPY OF THE REFERRAL MAILED TO HIM WHEN IT COMES BACK

## 2011-02-03 NOTE — Telephone Encounter (Signed)
Put in the [employer] referral and a response came back already. They do not authorize care since Medicare is primary.    Patient is aware and the rejection notice was e-mailed to him.  [email or web address]

## 2011-03-26 ENCOUNTER — Encounter

## 2011-03-28 LAB — HEMOGLOBIN A1C
Hemoglobin A1C: 5.7
eAG: 116.9 mg/dl

## 2011-03-28 LAB — CBC WITH AUTO DIFFERENTIAL
Basophils %: 0.6 % (ref 0.0–2.0)
Basophils Absolute: 0 10*3 (ref 0.0–0.2)
Eosinophils %: 6.3 % — ABNORMAL HIGH (ref 0.0–5.0)
Eosinophils Absolute: 0.3 10*3 (ref 0.0–0.6)
Granulocyte Absolute Count: 2.3 10*3 (ref 1.7–7.7)
Hematocrit: 42.1 % (ref 40.5–52.5)
Hemoglobin: 14.6 gm/dl (ref 13.5–17.5)
Lymphocytes %: 39.2 % (ref 25.0–40.0)
Lymphocytes Absolute: 2.1 10*3 (ref 1.0–5.1)
MCH: 33.6 pg (ref 26–34)
MCHC: 34.7 gm/dl (ref 31–36)
MCV: 96.9 fl (ref 80–100)
MPV: 8.5 fl (ref 5.0–10.5)
Monocytes %: 11 % (ref 0.0–12.0)
Monocytes Absolute: 0.6 10*3 (ref 0.0–1.3)
Platelets: 203 10*3 (ref 135–450)
RBC: 4.35 10*6 (ref 4.2–5.9)
RDW: 13.1 % (ref 11.5–14.5)
Segs Relative: 42.9 % (ref 42.0–63.0)
WBC: 5.4 10*3 (ref 4.0–11.0)

## 2011-03-28 LAB — BASIC METABOLIC PANEL
BUN: 15 mg/dl (ref 7–18)
CO2: 33 mEq/L — ABNORMAL HIGH (ref 21–32)
Calcium: 9 mg/dl (ref 8.3–10.6)
Chloride: 103 mEq/L (ref 99–110)
Creatinine: 1 mg/dl (ref 0.8–1.3)
GFR Est, African/Amer: >60
GFR, Estimated: >60 (ref >60)
Glucose: 85 mg/dl (ref 70–99)
Potassium: 4.1 mEq/L (ref 3.5–5.1)
Sodium: 140 mEq/L (ref 136–145)

## 2011-03-28 LAB — TSH: TSH: 2.44 u[IU]/mL (ref 0.35–5.5)

## 2011-03-28 LAB — LIPID PANEL
Cholesterol, Total: 158 mg/dl (ref <200)
HDL: 40 mg/dl (ref 40–60)
LDL Calculated: 98 mg/dl (ref <100)
Triglycerides: 98 mg/dl (ref <150)
VLDL Cholesterol Calculated: 20 mg/dl

## 2011-03-28 LAB — AST: AST: 28 U/L (ref 15–37)

## 2011-03-30 MED ORDER — VARDENAFIL HCL 20 MG PO TABS
20 | ORAL_TABLET | ORAL | Status: DC | PRN
Start: 2011-03-30 — End: 2011-10-05

## 2011-03-30 NOTE — Telephone Encounter (Signed)
Error

## 2011-03-30 NOTE — Progress Notes (Signed)
Subjective:      Patient ID: Brandon Powell is a 67 y.o. male.    HPI The patient is here for ongoing evaluation and treatment of hypertension. The patient has been instructed in the desirability regular exercise, ideal body weight and medication compliance. The patient has been advised to perform regular home blood pressure monitoring and to bring the results to every office visit. He does not have a list. He says that his BPs are always really good.    Labs look a little better re: his cholesterol.    We discussed his obesity - he says that he really only weighs 200 lbs, that he has a calibrated scale at home. I pointed out that, even if he really did weigh 200 lbs his BMI would be 29 and still significantly overweight. We set a goal of losing 30 pounds from whatever his present weight is.        Review of Systems   Respiratory: Negative for shortness of breath.    Cardiovascular: Negative for chest pain.   Gastrointestinal: Negative for abdominal pain.       Objective:   Physical Exam   Constitutional: He is oriented to person, place, and time. He appears well-developed. No distress.   Overweight.   Neck: Neck supple.   No carotid bruits R or L.   Cardiovascular: Normal rate, regular rhythm and normal heart sounds.  Exam reveals no gallop.    No murmur heard.  Pulmonary/Chest: He is in respiratory distress.   Abdominal: Soft. He exhibits no distension and no mass. There is no tenderness. There is no guarding.   Overweight in his gut.   Musculoskeletal: He exhibits no edema.   Neurological: He is alert and oriented to person, place, and time.   Skin: Skin is warm and dry. No rash noted. No erythema.   Psychiatric: He has a normal mood and affect. His behavior is normal. Judgment and thought content normal.       Assessment:      Patient Active Problem List    Diagnosis Date Noted   . Obesity (BMI 30.0-34.9) 03/30/2011   . OSA (obstructive sleep apnea) 07/28/2010   . Skin lesions, generalized 11/01/2009   .  Prediabetes 11/15/2008   . Dysphagia 11/15/2008   . ED (erectile dysfunction) 11/15/2008   . GERD (gastroesophageal reflux disease)    . HYPERTENSION    . HYPERCHOLESTEROLEMIA             Plan:      Patient Instructions   Continue current medications.  Try to lose weight, at least 30 lbs.  Continue exercise.  Return 6 months for a yearly physical.

## 2011-03-30 NOTE — Patient Instructions (Signed)
Continue current medications.  Try to lose weight, at least 30 lbs.  Continue exercise.  Return 6 months for a yearly physical.

## 2011-10-01 ENCOUNTER — Encounter

## 2011-10-03 LAB — LIPID PANEL
Cholesterol, Total: 192 mg/dL (ref 0–199)
HDL: 42 mg/dL (ref 40–60)
LDL Calculated: 120 mg/dL — ABNORMAL HIGH (ref 0–99)
Triglycerides: 149 mg/dL (ref 0–149)
VLDL Cholesterol Calculated: 30 mg/dL

## 2011-10-03 LAB — TSH: TSH: 1.48 u[IU]/mL (ref 0.35–5.50)

## 2011-10-03 LAB — BASIC METABOLIC PANEL
BUN: 13 mg/dL (ref 7–18)
CO2: 31 mEq/L (ref 21–32)
Calcium: 9.1 mg/dL (ref 8.3–10.6)
Chloride: 103 mEq/L (ref 99–110)
Creatinine: 0.9 mg/dL (ref 0.8–1.3)
GFR African American: >60 (ref >60)
GFR Non-African American: >60 (ref >60)
Glucose: 93 mg/dL (ref 70–99)
Potassium: 4.3 mEq/L (ref 3.5–5.1)
Sodium: 141 mEq/L (ref 136–145)

## 2011-10-03 LAB — CBC WITH AUTO DIFFERENTIAL
Basophils %: 0.3 %
Basophils Absolute: 0 10*3/uL (ref 0.0–0.2)
Eosinophils %: 3.8 %
Eosinophils Absolute: 0.2 10*3/uL (ref 0.0–0.6)
Hematocrit: 43.4 % (ref 40.5–52.5)
Hemoglobin: 14.9 g/dL (ref 13.5–17.5)
Lymphocytes %: 33.3 %
Lymphocytes Absolute: 2.1 10*3/uL (ref 1.0–5.1)
MCH: 33.5 pg (ref 26.0–34.0)
MCHC: 34.2 g/dL (ref 31.0–36.0)
MCV: 97.8 fL (ref 80.0–100.0)
MPV: 8.4 fL (ref 5.0–10.5)
Monocytes %: 11.6 %
Monocytes Absolute: 0.7 10*3/uL (ref 0.0–1.3)
Neutrophils %: 51 %
Neutrophils Absolute: 3.2 10*3/uL (ref 1.7–7.7)
Platelets: 209 10*3/uL (ref 135–450)
RBC: 4.44 M/uL (ref 4.20–5.90)
RDW: 12.9 % (ref 12.4–15.4)
WBC: 6.2 10*3/uL (ref 4.0–11.0)

## 2011-10-03 LAB — MICROALBUMIN / CREATININE URINE RATIO
Creatinine, Ur: 142 mg/dL
Microalbumin Creatinine Ratio: 2.1 mg/g (ref 0.0–30.0)
Microalbumin, Random Urine: <0.3 mg/dL (ref 0.00–1.80)

## 2011-10-03 LAB — PSA SCREENING: PSA: 1.49 ng/mL (ref 0.00–4.00)

## 2011-10-03 LAB — HEMOGLOBIN A1C
Hemoglobin A1C: 5.6 %
eAG: 114 mg/dL

## 2011-10-05 MED ORDER — VARDENAFIL HCL 20 MG PO TABS
20 | ORAL_TABLET | ORAL | Status: DC | PRN
Start: 2011-10-05 — End: 2012-02-26

## 2011-10-05 MED ORDER — SIMVASTATIN 20 MG PO TABS
20 MG | ORAL_TABLET | Freq: Every evening | ORAL | Status: DC
Start: 2011-10-05 — End: 2011-10-05

## 2011-10-05 MED ORDER — SIMVASTATIN 20 MG PO TABS
20 MG | ORAL_TABLET | Freq: Every evening | ORAL | Status: DC
Start: 2011-10-05 — End: 2012-02-03

## 2011-10-05 MED ORDER — LORATADINE 10 MG PO TABS
10 MG | ORAL_TABLET | Freq: Every day | ORAL | Status: DC
Start: 2011-10-05 — End: 2017-06-25

## 2011-10-05 NOTE — Progress Notes (Signed)
Subjective:      Patient ID: Brandon Powell is a 66 y.o. male.    HPI The patient is here for comprehensive evaluation of ongoing medical problems. The patient has been instructed in the benefits of a healthy diet, regular exercise and ideal body weight.  The patient is here for ongoing evaluation and treatment of hypertension. The patient has been instructed in the desirability regular exercise, ideal body weight and medication compliance. The patient has been advised to perform regular home blood pressure monitoring and to bring the results to every office visit. He has no list but checks and is averaging in the 110s / 70s.  The patient is here for evaluation of dyslipidemia. The patient has been instructed in diet and exercise.  He has prediabetes and has been losing weight, has lost 9 lbs, BMI is now 30.    Patient Active Problem List    Diagnosis Date Noted   ??? Skin lesion of right arm 10/05/2011   ??? Obesity (BMI 30.0-34.9) 03/30/2011   ??? OSA (obstructive sleep apnea) 07/28/2010   ??? Skin lesions, generalized 11/01/2009   ??? Prediabetes 11/15/2008   ??? Dysphagia 11/15/2008   ??? ED (erectile dysfunction) 11/15/2008   ??? GERD (gastroesophageal reflux disease)    ??? HYPERTENSION    ??? HYPERCHOLESTEROLEMIA            Review of Systems   Constitutional: Negative for appetite change and unexpected weight change.   HENT: Positive for tinnitus.         No change in tinnitus and loss, has a [employer] disability for it.   Respiratory: Negative for cough, chest tightness and shortness of breath.    Cardiovascular: Positive for leg swelling. Negative for chest pain and palpitations.        Minor dependent edema occasionally.   Gastrointestinal: Negative for abdominal pain.   Skin:        He has noticed a nodular growth over a few months on his R forearm.    Neurological: Negative for syncope and light-headedness.   Psychiatric/Behavioral: Positive for sleep disturbance.        He has been dx with OSA and saw Vanice Sarah last year but  decided to do nothing. He sleeps well and feels rested, does not fall asleep during the day.   All other systems reviewed and are negative.        Objective:   Physical Exam   Vitals reviewed.  Constitutional: He is oriented to person, place, and time. He appears well-developed and well-nourished. No distress.   HENT:   Head: Normocephalic and atraumatic.   Right Ear: External ear normal.   Left Ear: External ear normal.   Nose: Nose normal.   Mouth/Throat: Oropharynx is clear and moist. No oropharyngeal exudate.   Eyes: Conjunctivae and EOM are normal. Pupils are equal, round, and reactive to light. No scleral icterus.   Neck: Normal range of motion. Neck supple. No thyromegaly present.   Cardiovascular: Normal rate, regular rhythm, normal heart sounds and intact distal pulses.  Exam reveals no gallop and no friction rub.    No murmur heard.  No carotid bruits.   Pulmonary/Chest: Effort normal and breath sounds normal. No respiratory distress. He has no wheezes. He has no rales.   Abdominal: Soft. Bowel sounds are normal. He exhibits no distension and no mass. There is no tenderness. There is no guarding.   Genitourinary: Rectum normal, prostate normal and penis normal. Guaiac negative stool.  Musculoskeletal: Normal range of motion. He exhibits no edema and no tenderness.   Lymphadenopathy:     He has no cervical adenopathy.   Neurological: He is alert and oriented to person, place, and time. He exhibits normal muscle tone. Coordination normal.   Skin: Skin is warm and dry. No rash noted. No erythema.   4 x 5 mm raised pigmented lesion R forearm, color variegated, good sharp borders, no satellites.   Psychiatric: He has a normal mood and affect. His behavior is normal. Judgment and thought content normal.       Assessment:      Patient Active Problem List    Diagnosis Date Noted   ??? Skin lesion of right arm 10/05/2011   ??? Obesity (BMI 30.0-34.9) 03/30/2011   ??? OSA (obstructive sleep apnea) 07/28/2010   ??? Skin  lesions, generalized 11/01/2009   ??? Prediabetes 11/15/2008   ??? Dysphagia 11/15/2008   ??? ED (erectile dysfunction) 11/15/2008   ??? GERD (gastroesophageal reflux disease)    ??? HYPERTENSION    ??? HYPERCHOLESTEROLEMIA             Plan:      Patient Instructions   Your simvastatin dose should be 20 mg at bedtime.  Consider seeing Dr. Daryll Drown early for the skin lesion on your R arm.  See me in 6 months for a fasting blood test and an office visit.

## 2011-10-05 NOTE — Progress Notes (Signed)
Risks and benefits explained, current VIS given, consent signed.

## 2011-10-05 NOTE — Patient Instructions (Addendum)
Your simvastatin dose should be 20 mg at bedtime.  Consider seeing Dr. Daryll Drown early for the skin lesion on your R arm.  See me in 6 months for a fasting blood test and an office visit.

## 2011-10-05 NOTE — Addendum Note (Signed)
Addended by: Lurlean Leyden on: 10/05/2011 09:56 AM     Modules accepted: Orders

## 2011-11-06 MED ORDER — HYDROCORTISONE ACE-PRAMOXINE 1-1 % RE FOAM
1-1 % | RECTAL | Status: DC
Start: 2011-11-06 — End: 2012-10-10

## 2011-11-06 MED ORDER — AZITHROMYCIN 250 MG PO TABS
250 MG | PACK | ORAL | Status: AC
Start: 2011-11-06 — End: 2011-11-16

## 2011-11-06 NOTE — Progress Notes (Signed)
Subjective:      Patient ID: Brandon Powell is a 66 y.o. male.    HPI Last week he developed a hemorrhoid, very slight bleeding, at the same time his tailbone has been aggravated, hurts right on the tip of his tailbone. 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    At the same time he has developed a URI, usual sx of congestion, yellow sputum and nasal drainage.    Review of Systems   Constitutional: Negative for fever, chills and diaphoresis.   HENT: Positive for congestion and rhinorrhea.    Respiratory: Negative for shortness of breath.    Cardiovascular: Negative for chest pain.       Objective:   Physical Exam   Genitourinary:   1 cm external hemorrhoid. Rectal normal. Stool light brown heme neg. Skin in buttocks crack is irritated, appears to be moisture and friction etiology.       Assessment:      URI  Perirectal irritation probably due to recent moisture, sweating, friction, etc  hemorrhoid       Plan:      Patient Instructions   Take a Z-pak s directed for the respiratory infection.    For the hemorrhoid and the perianal skin irritation:  Clean the area with a flushable wipe after a BM.  Dry the area.  Apply proctofoam HC in and around the anus and rectum.  Apply Desitin ointment to the irritated skin in between your buttocks.    Return if all is not cleared up in 2-3 weeks.

## 2011-11-06 NOTE — Patient Instructions (Addendum)
Take a Z-pak s directed for the respiratory infection.    For the hemorrhoid and the perianal skin irritation:  Clean the area with a flushable wipe after a BM.  Dry the area.  Apply proctofoam HC in and around the anus and rectum.  Apply Desitin ointment to the irritated skin in between your buttocks.    Return if all is not cleared up in 2-3 weeks.

## 2011-12-28 NOTE — Patient Instructions (Addendum)
Aleve twice daily as needed, take with food, but if not improving in a week or two, call and I'll send you to a hand specialist for further treatment.

## 2011-12-28 NOTE — Progress Notes (Signed)
Subjective:      Patient ID: Brandon Powell is a 67 y.o. male.    HPI He awoke a few days ago with a painful bump on his R thumb dorsal surface of DIP. No known source of infection. No known insect bite.    Review of Systems   Constitutional: Negative for fever, chills and diaphoresis.   Gastrointestinal:        His recent hemorrhoid cleared completely with treatment.       Objective:   Physical Exam   Musculoskeletal:   The R thumb has a ~ 1 cm nodule over the joint that is firm and slightly red, feels below the skin, feels c/w a ganglion cyst.        Assessment:      Probable ganglion cyst of R thumb DIP       Plan:      Aleve twice daily as needed, but if not improving in a week or two, call and I'll send you to a hand specialist for further treatment.

## 2012-02-01 MED ORDER — AZITHROMYCIN 250 MG PO TABS
250 MG | PACK | ORAL | Status: DC
Start: 2012-02-01 — End: 2012-04-04

## 2012-02-01 NOTE — Patient Instructions (Addendum)
Zpak (Azithromycin) Take 2 today then 1 daily until all gone; take with food.  Call if worse

## 2012-02-01 NOTE — Progress Notes (Signed)
Subjective:      Patient ID: Brandon Powell is a 67 y.o. male.    HPI 11 days ago he started to come down with a cold, felt better after a week or so and thought he had gotten over it, then 2 days ago started to feel very sore throat, throbbing headache, coughing up dark yellow phlegm. Today, all sx persist. No fever, chills or sweats. He has been taking Aleve cold and sinus med. He had a flu shot this year.     Review of Systems   Constitutional: Negative for fever, chills and diaphoresis.   HENT: Positive for congestion.    Respiratory: Positive for cough. Negative for shortness of breath.         Hurts when he coughs.       Objective:   Physical Exam   HENT:   Right Ear: External ear normal.   Left Ear: External ear normal.   Mouth/Throat: No oropharyngeal exudate.   Throat red, no exudate. No cervical nodes but a little tender there.   Pulmonary/Chest: Effort normal and breath sounds normal. No respiratory distress. He has no wheezes. He has no rales.   Lymphadenopathy:     He has no cervical adenopathy.       Assessment:      Respiratory infection for ~ 12 days. Does not sound like influenza.       Plan:      Rapid strep:

## 2012-02-03 MED ORDER — SIMVASTATIN 20 MG PO TABS
20 MG | ORAL_TABLET | ORAL | Status: DC
Start: 2012-02-03 — End: 2013-03-27

## 2012-02-26 MED ORDER — LEVITRA 20 MG PO TABS
20 MG | ORAL_TABLET | ORAL | Status: DC
Start: 2012-02-26 — End: 2014-04-02

## 2012-03-24 ENCOUNTER — Encounter

## 2012-03-25 ENCOUNTER — Encounter

## 2012-03-25 MED ORDER — DOXYCYCLINE HYCLATE 100 MG PO TABS
100 MG | ORAL_TABLET | Freq: Two times a day (BID) | ORAL | Status: DC
Start: 2012-03-25 — End: 2012-04-04

## 2012-03-25 NOTE — Progress Notes (Signed)
Subjective:      Patient ID: Brandon Powell is a 67 y.o. male.    HPI Flew twice 5-6 days ago, then helped a friend take down a suspended ceiling 4 days ago and there were either rats or mice or both in the ceiling. Within 2 hours into the ceiling job he was wheezing and coughing. His friend who was doing the same ceiling job is fine.  He is currently congested and coughing up yellow phlegm, "big head", didn't take temp but felt feverish to him, and achy.   No SOB.  Last night he had more nasal congestion.     Review of Systems    Objective:   Physical Exam   Constitutional: He appears well-developed and well-nourished. No distress.   HENT:   Mouth/Throat: Oropharynx is clear and moist. No oropharyngeal exudate.   Neck:   Slight enlargement of ant cervical nodes, sl tenderness to palpation   Cardiovascular: Normal rate, regular rhythm and normal heart sounds.    Pulmonary/Chest: Effort normal and breath sounds normal. No respiratory distress. He has no wheezes. He has no rales.   Dry-sounding occasional cough, no resp distress, no tachypnea, no rales or rhonchi or wheezes   Lymphadenopathy:     He has cervical adenopathy.   Skin: No rash noted.       Assessment:    Acute respiratory infection. The timeframe is not in keeping with this being due to exposure to rat & mouse feces with the ceiling work, since sx started within 2 hours of exposure. However, there is possible inhalation of rat / mouse feces.  I checked the CDC website this am and there is no prophylaxis for Hantavirus and none recommended for other infections. However, treatment with doxycycline might treat some bacterial sources. Since he is exposed and has a respiratory infection, I will cover him with doxy.       Plan:      Doxycycline 100 mg twice daily for 10 days.  CBC added to blood work today.  See me in 10 days to follow up. Call if worse.  We had a discussion of the importance of going to the ER if he develops any severe or progressive sx.  He is also going to check the [facility] website for further education.

## 2012-03-26 LAB — CBC WITH AUTO DIFFERENTIAL
Basophils %: 0.4 %
Basophils Absolute: 0 10*3/uL (ref 0.0–0.2)
Eosinophils %: 6.4 %
Eosinophils Absolute: 0.4 10*3/uL (ref 0.0–0.6)
Hematocrit: 44.6 % (ref 40.5–52.5)
Hemoglobin: 14.7 g/dL (ref 13.5–17.5)
Lymphocytes %: 17.9 %
Lymphocytes Absolute: 1.1 10*3/uL (ref 1.0–5.1)
MCH: 31.6 pg (ref 26.0–34.0)
MCHC: 33 g/dL (ref 31.0–36.0)
MCV: 96 fL (ref 80.0–100.0)
MPV: 8.9 fL (ref 5.0–10.5)
Monocytes %: 14.2 %
Monocytes Absolute: 0.9 10*3/uL (ref 0.0–1.3)
Neutrophils %: 61.1 %
Neutrophils Absolute: 3.9 10*3/uL (ref 1.7–7.7)
Platelets: 218 10*3/uL (ref 135–450)
RBC: 4.65 M/uL (ref 4.20–5.90)
RDW: 13.1 % (ref 12.4–15.4)
WBC: 6.4 10*3/uL (ref 4.0–11.0)

## 2012-03-26 LAB — LIPID PANEL
Cholesterol, Total: 156 mg/dL (ref 0–199)
HDL: 37 mg/dL — ABNORMAL LOW (ref 40–60)
LDL Calculated: 98 mg/dL (ref <100)
Triglycerides: 103 mg/dL (ref 0–150)
VLDL Cholesterol Calculated: 21 mg/dL

## 2012-03-26 LAB — HEMOGLOBIN A1C
Hemoglobin A1C: 5.3 %
eAG: 105.4 mg/dL

## 2012-03-26 LAB — TSH: TSH: 2.09 u[IU]/mL (ref 0.27–4.20)

## 2012-03-26 LAB — BASIC METABOLIC PANEL
BUN: 8 mg/dL (ref 7–20)
CO2: 24 mmol/L (ref 21–32)
Calcium: 8.9 mg/dL (ref 8.3–10.6)
Chloride: 99 mmol/L (ref 99–110)
Creatinine: 0.8 mg/dL (ref 0.8–1.3)
GFR African American: >60 (ref >60)
GFR Non-African American: >60 (ref >60)
Glucose: 105 mg/dL — ABNORMAL HIGH (ref 70–99)
Potassium: 3.9 mmol/L (ref 3.5–5.1)
Sodium: 138 mmol/L (ref 136–145)

## 2012-03-26 NOTE — Progress Notes (Signed)
Quick Note:    I informed him re: no elevation of the WBC. He feels about the same, no worse.  ______

## 2012-04-04 NOTE — Progress Notes (Signed)
Subjective:      Patient ID: NOBLE CICALESE is a 67 y.o. male.    HPI He is here to f/u from 10 days ago. At that time, he had a history of having helped a friend take down a suspended ceiling 4 days prior and there were either rats or mice or both in the ceiling. Within 2 hours into the ceiling job he was wheezing and coughing. His friend who was doing the same ceiling job was fine.   He was congested and coughing up yellow phlegm, "big head", he didn't take temp but felt feverish to him, and achy.   No SOB. He had a normal temp, WBC and normal pulmonary exam. I placed him on doxycycline 100 BID x 10 days.     Today, he has completed his doxycycline. He never had any severe symptoms. He had a gradual improvement and still has a slight cough with some small amount of yellow sputum. He feels better.     We also discussed his BP and his other chronic issues. Home BPs very good, all within the normal range (on no BP med) so his dx of HTN is historical. Discussed his recent labs.      Review of Systems   Constitutional: Negative for fever, chills and diaphoresis.   Respiratory: Negative for shortness of breath and wheezing.        Objective:   Physical Exam   HENT:   Mouth/Throat: Oropharynx is clear and moist. No oropharyngeal exudate.   Cardiovascular: Normal rate, regular rhythm and normal heart sounds.    Pulmonary/Chest: Effort normal and breath sounds normal. No respiratory distress. He has no wheezes. He has no rales.   Lymphadenopathy:     He has no cervical adenopathy.       Assessment:      Typical respiratory infection, almost completely resolved. No sign of any atypical infection related to the rat feces exposure.     Patient Active Problem List    Diagnosis Date Noted   ??? Ganglion cyst 12/28/2011   ??? Skin lesion of right arm 10/05/2011   ??? Obesity (BMI 30.0-34.9) 03/30/2011   ??? OSA (obstructive sleep apnea) 07/28/2010   ??? Skin lesions, generalized 11/01/2009   ??? Prediabetes 11/15/2008   ??? Dysphagia  11/15/2008   ??? ED (erectile dysfunction) 11/15/2008   ??? GERD (gastroesophageal reflux disease)    ??? HYPERTENSION    ??? HYPERCHOLESTEROLEMIA            Plan:      No further treatment necessary.    Patient Instructions   Continue same medications.  Return in 6 months for a yearly physical exam.

## 2012-04-04 NOTE — Patient Instructions (Signed)
Continue same medications.  Return in 6 months for a yearly physical exam.

## 2012-06-29 NOTE — Progress Notes (Signed)
Subjective:      Patient ID: Brandon Powell is a 67 y.o. male.    Ear Problem  Pertinent negatives include no fever or sore throat.      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   HENT: Positive for ear pain (not pain but fullness). Negative for sore throat, trouble swallowing and sinus pressure.        Objective:   Physical Exam   HENT:   Right Ear: External ear normal.   Mouth/Throat: No oropharyngeal exudate.   Left TM slightly retracted  Good landmarks    Neck: No thyromegaly present.   Lymphadenopathy:     He has no cervical adenopathy.       Assessment:         1. Eustachian tube disorder, left  External Referral To ENT             Plan:         Patient Instructions   Use the nasonex 2 sprays each nostril daily  Try over the counter mucenix   twice a day  If symptoms do not improve make appt with ENT     If this does not improve I would have him see ENT    Names given  If they are not on his plan call us with some names

## 2012-06-29 NOTE — Patient Instructions (Addendum)
Use the nasonex 2 sprays each nostril daily  Try over the counter mucenix   twice a day  If symptoms do not improve make appt with ENT

## 2012-07-11 MED ORDER — CIPROFLOXACIN HCL 500 MG PO TABS
500 MG | ORAL_TABLET | Freq: Two times a day (BID) | ORAL | Status: AC
Start: 2012-07-11 — End: 2012-07-21

## 2012-07-11 NOTE — Progress Notes (Signed)
Subjective:      Patient ID: Brandon Powell is a 67 y.o. male.    HPI He was in [place] last week and as he was leaving developed watery diarrhea, maybe 8 / day, cramps, no blood or mucous, nausea but no vomiting, occasional chill but not much. He has not taken anything.     Review of Systems   Constitutional: Negative for fever.   HENT:        His L ear still feels full - he plans to see the ENT soon (see previous OV with Dr. Doristine Counter)   Gastrointestinal: Positive for abdominal pain and diarrhea. Negative for blood in stool.       Objective:   Physical Exam   HENT:   Left Ear: External ear normal.   L TM and canal normal   Abdominal: Soft. Bowel sounds are normal. He exhibits no distension and no mass. There is tenderness. There is no rebound and no guarding.   Very mild diffuse abdominal tenderness       Assessment:      Traveler's diarrhea  Chronic L ear fullness       Plan:      Patient Instructions   Cipro 500 mg twice daily for 10 days.  Call if no better in three days - call anytime if worse.  OK to use Immodium for a day or two, as per package diredtions.    He will call the ENT for evaluation (see prior OV.)

## 2012-07-11 NOTE — Telephone Encounter (Signed)
FYI  PT. MADE A SICK APPOINTMENT FOR TODAY 6/23.  PT. [place].  PT. RETURNED WITH DIARRHEA AND NAUSEA.  PT. REPORTS THAT [employer] MAY CALL THIS OFFICE TODAY IN REFERENCE TO THIS PT's. ILLNESS.

## 2012-07-11 NOTE — Patient Instructions (Addendum)
Cipro 500 mg twice daily for 10 days.  Call if no better in three days - call anytime if worse.  OK to use Immodium for a day or two, as per package diredtions.

## 2012-07-11 NOTE — Telephone Encounter (Signed)
Ok appt is for later today.

## 2012-10-06 ENCOUNTER — Encounter

## 2012-10-08 LAB — CBC WITH AUTO DIFFERENTIAL
Basophils %: 0.4 %
Basophils Absolute: 0 10*3/uL (ref 0.0–0.2)
Eosinophils %: 5 %
Eosinophils Absolute: 0.3 10*3/uL (ref 0.0–0.6)
Hematocrit: 40.4 % — ABNORMAL LOW (ref 40.5–52.5)
Hemoglobin: 14.1 g/dL (ref 13.5–17.5)
Lymphocytes %: 34.2 %
Lymphocytes Absolute: 2 10*3/uL (ref 1.0–5.1)
MCH: 34.8 pg — ABNORMAL HIGH (ref 26.0–34.0)
MCHC: 34.9 g/dL (ref 31.0–36.0)
MCV: 99.8 fL (ref 80.0–100.0)
MPV: 8.9 fL (ref 5.0–10.5)
Monocytes %: 9.1 %
Monocytes Absolute: 0.5 10*3/uL (ref 0.0–1.3)
Neutrophils %: 51.3 %
Neutrophils Absolute: 3 10*3/uL (ref 1.7–7.7)
Platelets: 189 10*3/uL (ref 135–450)
RBC: 4.05 M/uL — ABNORMAL LOW (ref 4.20–5.90)
RDW: 13.5 % (ref 12.4–15.4)
WBC: 5.8 10*3/uL (ref 4.0–11.0)

## 2012-10-08 LAB — LIPID PANEL
Cholesterol, Total: 156 mg/dL (ref 0–199)
HDL: 37 mg/dL — ABNORMAL LOW (ref 40–60)
LDL Calculated: 93 mg/dL (ref <100)
Triglycerides: 128 mg/dL (ref 0–150)
VLDL Cholesterol Calculated: 26 mg/dL

## 2012-10-08 LAB — BASIC METABOLIC PANEL
BUN: 14 mg/dL (ref 7–20)
CO2: 28 mmol/L (ref 21–32)
Calcium: 9 mg/dL (ref 8.3–10.6)
Chloride: 101 mmol/L (ref 99–110)
Creatinine: 0.8 mg/dL (ref 0.8–1.3)
GFR African American: >60 (ref >60)
GFR Non-African American: >60 (ref >60)
Glucose: 88 mg/dL (ref 70–99)
Potassium: 4 mmol/L (ref 3.5–5.1)
Sodium: 138 mmol/L (ref 136–145)

## 2012-10-08 LAB — HEMOGLOBIN A1C
Hemoglobin A1C: 5.4 %
eAG: 108.3 mg/dL

## 2012-10-08 LAB — PSA SCREENING: PSA: 1.98 ng/mL (ref 0.00–4.00)

## 2012-10-08 LAB — TSH: TSH: 2.64 u[IU]/mL (ref 0.27–4.20)

## 2012-10-10 NOTE — Patient Instructions (Signed)
Continue same medications.    Return in 6 months for a fasting blood test and an office visit.

## 2012-10-10 NOTE — Addendum Note (Signed)
Addended by: Lurlean Leyden on: 10/10/2012 09:07 AM     Modules accepted: Orders
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
## 2013-02-01 MED ORDER — AZITHROMYCIN 250 MG PO TABS
250 MG | PACK | ORAL | Status: AC
Start: 2013-02-01 — End: 2013-02-11

## 2013-02-01 NOTE — Telephone Encounter (Signed)
Sore throat, stuffy, coughing productive yelllow, no fever. No exposure to the flu.  But did [place] and back.  Taking Aleve cold and sinus and is drying up a little.      Per Dr Suzie PortelaPayne- if symptoms for 1-2 days come in and test for flu. If more than 2 days call in z-pak    Per patient he started with symptoms on Sunday 01/29/13. He would like the z-pak. He would not have been able to come in for appointment until tomorrow. He will watch to see if he gets a fever if he does or gets worse he will come in.    Medication e-scribed.

## 2013-02-01 NOTE — Telephone Encounter (Signed)
PT CALLING WITH URI SYMPTOMS AND WONDERS IF DR IS WILLING TO CALL IN Z-PAK FOR HIM TO [phone].

## 2013-03-23 ENCOUNTER — Encounter

## 2013-03-27 MED ORDER — SIMVASTATIN 20 MG PO TABS
20 MG | ORAL_TABLET | ORAL | Status: DC
Start: 2013-03-27 — End: 2013-10-16

## 2013-03-27 NOTE — Progress Notes (Signed)
Subjective:      Patient ID: Brandon Powell is a 68 y.o. male.    HPI The patient is here for ongoing evaluation and treatment of hypertension. The patient has been instructed in the desirability of regular exercise, ideal body weight and medication compliance. The patient has been advised to perform regular home blood pressure monitoring and to bring the results to every office visit. Home BPs good, 110-112/65-70, no list.  The patient is here for evaluation of dyslipidemia. The patient has been instructed in diet and exercise. He has not been able to lose weight.    Review of Systems   Constitutional: Negative for appetite change and unexpected weight change.   Eyes:        Dr. Tommie ArdZelchek has seen him several times over past 6 months, has dx floaters but no detachment   Respiratory: Negative for cough, chest tightness and shortness of breath.    Cardiovascular: Negative for chest pain, palpitations and leg swelling.   Gastrointestinal: Negative for abdominal pain.   Neurological: Negative for syncope and light-headedness.        No recurrence of R leg sx in past 6 mos.       Objective:   Physical Exam   Constitutional: He appears well-developed and well-nourished. No distress.   Cardiovascular: Normal rate, regular rhythm, normal heart sounds and intact distal pulses.  Exam reveals no gallop.    No murmur heard.  No carotid bruits.  R pedal pulses NL with good color and refill.   Pulmonary/Chest: Effort normal and breath sounds normal. No respiratory distress. He has no wheezes. He has no rales.   Abdominal: Soft. He exhibits no distension and no mass. There is no tenderness. There is no guarding.   Musculoskeletal: He exhibits no edema.   Lymphadenopathy:     He has no cervical adenopathy.   Psychiatric: He has a normal mood and affect. His behavior is normal. Judgment and thought content normal.       Assessment:      Patient Active Problem List    Diagnosis Date Noted   ??? Obesity (BMI 30.0-34.9) 03/30/2011    ??? Dysphagia 11/15/2008   ??? ED (erectile dysfunction) 11/15/2008   ??? HYPERTENSION    ??? HYPERCHOLESTEROLEMIA      He appears to be stable. No recurrence of R leg symptoms (see prior OV)      Plan:      Patient Instructions   Continue same medications.  We'll call with the blood results.  Return in 6 months for a yearly physical exam.

## 2013-03-27 NOTE — Patient Instructions (Signed)
Continue same medications.  We'll call with the blood results.  Return in 6 months for a yearly physical exam.

## 2013-03-28 LAB — LIPID PANEL
Cholesterol, Total: 164 mg/dL (ref 0–199)
HDL: 41 mg/dL (ref 40–60)
LDL Calculated: 96 mg/dL (ref <100)
Triglycerides: 137 mg/dL (ref 0–150)
VLDL Cholesterol Calculated: 27 mg/dL

## 2013-03-28 LAB — CBC WITH AUTO DIFFERENTIAL
Basophils %: 0.2 %
Basophils Absolute: 0 10*3/uL (ref 0.0–0.2)
Eosinophils %: 7.1 %
Eosinophils Absolute: 0.4 10*3/uL (ref 0.0–0.6)
Hematocrit: 43.4 % (ref 40.5–52.5)
Hemoglobin: 14.5 g/dL (ref 13.5–17.5)
Lymphocytes %: 35.4 %
Lymphocytes Absolute: 2 10*3/uL (ref 1.0–5.1)
MCH: 33.1 pg (ref 26.0–34.0)
MCHC: 33.4 g/dL (ref 31.0–36.0)
MCV: 99.1 fL (ref 80.0–100.0)
MPV: 8.5 fL (ref 5.0–10.5)
Monocytes %: 10.4 %
Monocytes Absolute: 0.6 10*3/uL (ref 0.0–1.3)
Neutrophils %: 46.9 %
Neutrophils Absolute: 2.7 10*3/uL (ref 1.7–7.7)
Platelets: 217 10*3/uL (ref 135–450)
RBC: 4.38 M/uL (ref 4.20–5.90)
RDW: 13.5 % (ref 12.4–15.4)
WBC: 5.7 10*3/uL (ref 4.0–11.0)

## 2013-03-28 LAB — BASIC METABOLIC PANEL
Anion Gap: 14 (ref 3–16)
BUN: 12 mg/dL (ref 7–20)
CO2: 27 mmol/L (ref 21–32)
Calcium: 9 mg/dL (ref 8.3–10.6)
Chloride: 101 mmol/L (ref 99–110)
Creatinine: 0.8 mg/dL (ref 0.8–1.3)
GFR African American: >60 (ref >60)
GFR Non-African American: >60 (ref >60)
Glucose: 93 mg/dL (ref 70–99)
Potassium: 4.5 mmol/L (ref 3.5–5.1)
Sodium: 142 mmol/L (ref 136–145)

## 2013-03-28 LAB — TSH: TSH: 2.19 u[IU]/mL (ref 0.27–4.20)

## 2013-10-12 ENCOUNTER — Encounter

## 2013-10-13 LAB — CBC WITH AUTO DIFFERENTIAL
Basophils %: 0.5 %
Basophils Absolute: 0 10*3/uL (ref 0.0–0.2)
Eosinophils %: 5.4 %
Eosinophils Absolute: 0.3 10*3/uL (ref 0.0–0.6)
Hematocrit: 42.8 % (ref 40.5–52.5)
Hemoglobin: 14.6 g/dL (ref 13.5–17.5)
Lymphocytes %: 39.9 %
Lymphocytes Absolute: 2.4 10*3/uL (ref 1.0–5.1)
MCH: 33.7 pg (ref 26.0–34.0)
MCHC: 34.1 g/dL (ref 31.0–36.0)
MCV: 98.9 fL (ref 80.0–100.0)
MPV: 8.5 fL (ref 5.0–10.5)
Monocytes %: 10.8 %
Monocytes Absolute: 0.6 10*3/uL (ref 0.0–1.3)
Neutrophils %: 43.4 %
Neutrophils Absolute: 2.6 10*3/uL (ref 1.7–7.7)
Platelets: 218 10*3/uL (ref 135–450)
RBC: 4.33 M/uL (ref 4.20–5.90)
RDW: 12.7 % (ref 12.4–15.4)
WBC: 6 10*3/uL (ref 4.0–11.0)

## 2013-10-13 LAB — BASIC METABOLIC PANEL
Anion Gap: 14 (ref 3–16)
BUN: 11 mg/dL (ref 7–20)
CO2: 27 mmol/L (ref 21–32)
Calcium: 9 mg/dL (ref 8.3–10.6)
Chloride: 101 mmol/L (ref 99–110)
Creatinine: 0.8 mg/dL (ref 0.8–1.3)
GFR African American: >60 (ref >60)
GFR Non-African American: >60 (ref >60)
Glucose: 81 mg/dL (ref 70–99)
Potassium: 4.4 mmol/L (ref 3.5–5.1)
Sodium: 142 mmol/L (ref 136–145)

## 2013-10-13 LAB — LIPID PANEL
Cholesterol, Total: 149 mg/dL (ref 0–199)
HDL: 38 mg/dL — ABNORMAL LOW (ref 40–60)
LDL Calculated: 85 mg/dL (ref <100)
Triglycerides: 128 mg/dL (ref 0–150)
VLDL Cholesterol Calculated: 26 mg/dL

## 2013-10-13 LAB — TSH: TSH: 2.78 u[IU]/mL (ref 0.27–4.20)

## 2013-10-13 LAB — PSA SCREENING: PSA: 1.32 ng/mL (ref 0.00–4.00)

## 2013-10-16 MED ORDER — SIMVASTATIN 20 MG PO TABS
20 MG | ORAL_TABLET | ORAL | Status: DC
Start: 2013-10-16 — End: 2014-10-28

## 2013-10-16 NOTE — Progress Notes (Signed)
Subjective:      Patient ID: Brandon Powell is a 68 y.o. male.    HPI The patient is here for comprehensive evaluation of ongoing medical problems. The patient has been instructed in the benefits of a healthy diet, regular exercise and ideal body weight.  The patient is here for evaluation of dyslipidemia. The patient has been instructed in diet and exercise. He does basic exercises, walks, rides bikes. He has been decreasing amount of food, he and his wife are dieting together. He has lost 2 lbs and his cholesterol is lower.    He pointed out that he does not believe that he is hypertensive. 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 The dx on the EMR was historical. I removed the dx of HTN.    Review of Systems   Constitutional: Negative for appetite change and unexpected weight change.   Respiratory: Negative for cough, chest tightness and shortness of breath.    Cardiovascular: Negative for chest pain, palpitations and leg swelling.   Gastrointestinal: Negative for abdominal pain.        He has had no recent dysphagia.    He is due colonoscopy in 2018 (10-year f/u)   Genitourinary:        I gave him some Viagra 50 mg samples, had no Levitra.   Skin:        Gets a yearly skin check, Dr. Daryll Drown.   Neurological: Negative for syncope and light-headedness.   All other systems reviewed and are negative.      Objective:   Physical Exam   Constitutional: He is oriented to person, place, and time. He appears well-developed and well-nourished. No distress.   HENT:   Head: Normocephalic and atraumatic.   Right Ear: External ear normal.   Left Ear: External ear normal.   Nose: Nose normal.   Mouth/Throat: Oropharynx is clear and moist. No oropharyngeal exudate.   Eyes: Conjunctivae and EOM are normal. Pupils are equal, round, and reactive to light. No scleral icterus.   Neck: Normal range of motion. Neck supple. No thyromegaly present.   Cardiovascular: Normal rate, regular rhythm,  normal heart sounds and intact distal pulses.  Exam reveals no gallop and no friction rub.    No murmur heard.  No carotid bruits.   Pulmonary/Chest: Effort normal and breath sounds normal. No respiratory distress. He has no wheezes. He has no rales.   Abdominal: Soft. Bowel sounds are normal. He exhibits no distension and no mass. There is no tenderness. There is no guarding.   Genitourinary: Rectum normal, prostate normal and penis normal. Guaiac negative stool.   Musculoskeletal: Normal range of motion. He exhibits no edema or tenderness.   Lymphadenopathy:     He has no cervical adenopathy.   Neurological: He is alert and oriented to person, place, and time. He exhibits normal muscle tone. Coordination normal.   Skin: Skin is warm and dry. No rash noted. No erythema.   He has 2 small (2-3 mm) red flat lesions on his scalp that look like AK. He sees Dr. Daryll Drown yearly for a check.   Psychiatric: He has a normal mood and affect. His behavior is normal. Judgment and thought content normal.   Vitals reviewed.      Assessment:        Patient Active Problem List    Diagnosis Date Noted   ??? Obesity (BMI 30.0-34.9) 03/30/2011   ??? ED (erectile dysfunction)  11/15/2008   ??? HYPERCHOLESTEROLEMIA         Plan:      Continue same medications.    Return in 6 months for a fasting blood test and an office visit.    Patient Instructions   Continue the weight-loss diet and regular exercise.    Continue same medications.    Return in 6 months for a fasting blood test and an office visit.

## 2013-10-16 NOTE — Patient Instructions (Signed)
Continue the weight-loss diet and regular exercise.    Continue same medications.    Return in 6 months for a fasting blood test and an office visit.

## 2013-10-20 ENCOUNTER — Encounter: Admit: 2013-10-20 | Discharge: 2013-10-20 | Payer: MEDICARE | Primary: Internal Medicine

## 2013-10-20 DIAGNOSIS — Z23 Encounter for immunization: Secondary | ICD-10-CM

## 2014-02-06 MED ORDER — AZITHROMYCIN 250 MG PO TABS
250 MG | PACK | ORAL | Status: AC
Start: 2014-02-06 — End: 2014-02-16

## 2014-02-06 NOTE — Telephone Encounter (Signed)
PT CALLING SAYING HE HAS BEEN TRAVELING ALL OVER THE COUNTRY AND BELIEVES HE HAS PICKED UP A BUG.   HE SAYS HE HAS PERSISTENT COUGH, DRY SORE THROAT, STUFF HEAD, BUT DOES NOT BELIEVE HE HAS A FEVER.   HE SAYS WHEN HE COUGHS OR BLOWS IT IS YELLOW/GREEN.   HE IS WANTING A Z-PAK CALLED IN TO [facility] [phone].  PLEASE LET HIM KNOW IF DR WILL ALLOW THIS.

## 2014-02-06 NOTE — Telephone Encounter (Signed)
Rx e-scribed  Patient aware

## 2014-02-06 NOTE — Telephone Encounter (Signed)
PT ALSO SAYS HE HAS BEEN USING [employer] BUT IT IS NOT HELPING

## 2014-02-06 NOTE — Telephone Encounter (Signed)
OK to call in a Z-pak

## 2014-03-29 ENCOUNTER — Encounter

## 2014-03-30 ENCOUNTER — Encounter: Admit: 2014-03-30 | Discharge: 2014-03-30 | Payer: MEDICARE | Primary: Internal Medicine

## 2014-03-31 LAB — BASIC METABOLIC PANEL
Anion Gap: 13 (ref 3–16)
BUN: 14 mg/dL (ref 7–20)
CO2: 27 mmol/L (ref 21–32)
Calcium: 9.1 mg/dL (ref 8.3–10.6)
Chloride: 102 mmol/L (ref 99–110)
Creatinine: 0.8 mg/dL (ref 0.8–1.3)
GFR African American: >60 (ref >60)
GFR Non-African American: >60 (ref >60)
Glucose: 92 mg/dL (ref 70–99)
Potassium: 4.5 mmol/L (ref 3.5–5.1)
Sodium: 142 mmol/L (ref 136–145)

## 2014-03-31 LAB — CBC WITH AUTO DIFFERENTIAL
Basophils %: 0.5 %
Basophils Absolute: 0 10*3/uL (ref 0.0–0.2)
Eosinophils %: 6.5 %
Eosinophils Absolute: 0.4 10*3/uL (ref 0.0–0.6)
Hematocrit: 44.3 % (ref 40.5–52.5)
Hemoglobin: 14.6 g/dL (ref 13.5–17.5)
Lymphocytes %: 36.9 %
Lymphocytes Absolute: 2.2 10*3/uL (ref 1.0–5.1)
MCH: 32.2 pg (ref 26.0–34.0)
MCHC: 33 g/dL (ref 31.0–36.0)
MCV: 97.6 fL (ref 80.0–100.0)
MPV: 8.4 fL (ref 5.0–10.5)
Monocytes %: 9.9 %
Monocytes Absolute: 0.6 10*3/uL (ref 0.0–1.3)
Neutrophils %: 46.2 %
Neutrophils Absolute: 2.8 10*3/uL (ref 1.7–7.7)
Platelets: 223 10*3/uL (ref 135–450)
RBC: 4.54 M/uL (ref 4.20–5.90)
RDW: 13.2 % (ref 12.4–15.4)
WBC: 6 10*3/uL (ref 4.0–11.0)

## 2014-03-31 LAB — LIPID PANEL
Cholesterol, Total: 163 mg/dL (ref 0–199)
HDL: 39 mg/dL — ABNORMAL LOW (ref 40–60)
LDL Calculated: 103 mg/dL — ABNORMAL HIGH (ref <100)
Triglycerides: 105 mg/dL (ref 0–150)
VLDL Cholesterol Calculated: 21 mg/dL

## 2014-03-31 LAB — TSH: TSH: 2.72 u[IU]/mL (ref 0.27–4.20)

## 2014-04-02 ENCOUNTER — Ambulatory Visit: Admit: 2014-04-02 | Discharge: 2014-04-02 | Payer: MEDICARE | Attending: Internal Medicine | Primary: Internal Medicine

## 2014-04-02 DIAGNOSIS — R002 Palpitations: Secondary | ICD-10-CM

## 2014-04-02 MED ORDER — SILDENAFIL CITRATE 20 MG PO TABS
20 MG | ORAL_TABLET | ORAL | Status: DC
Start: 2014-04-02 — End: 2014-08-02

## 2014-04-02 NOTE — Progress Notes (Signed)
Subjective:      Patient ID: Brandon Powell is a 69 y.o. male.    HPI The patient is here for evaluation of dyslipidemia. The patient has been instructed in diet and exercise. His LDL is reasonable on simvastatin.    He does not have HTN - he is not treated with antihypertensive medication and his home BPs (checks occasionally) and BP here has been non-hypertensive.      Review of Systems   HENT:        He has had sleep apnea testing in the past and was told that he had an "excess uvula" and he wants this looked at. I recommend Lowella FairyJoe Hellmann.   Cardiovascular:        Every 2-3 months his heart seems to be "pumping heavy" and his BP goes up to 150/80. This is not in response to anything in particular. He works out on machines several times per week, 15-20 min, with no heart pumping heavy at those times.  He had a similar episode in [place] and [facility] [place] (normal) and another one as a precaution in 2008 (also normal.)    Skin:        He had a SK that came partially off a couple of weeks ago and is still bothering him. He will see Heath Goldiffany Pickup.       Objective:   Physical Exam   Constitutional: He appears well-developed and well-nourished. No distress.   Cardiovascular: Normal rate, regular rhythm, normal heart sounds and intact distal pulses.  Exam reveals no gallop.    No murmur heard.  No carotid bruits.   Pulmonary/Chest: Effort normal and breath sounds normal. No respiratory distress. He has no wheezes. He has no rales.   Abdominal: Soft. He exhibits no distension and no mass. There is no tenderness. There is no guarding.   Musculoskeletal: He exhibits no edema.   Lymphadenopathy:     He has no cervical adenopathy.   Skin:   Typical SK small ~ 6-7 mm diam, partially-separated from his back, no infection   Psychiatric: He has a normal mood and affect. His behavior is normal. Judgment and thought content normal.       Assessment:      Palpitation-type symptoms of heart, recurrent  from ~ 2007. Uncertain etiology.  Partially-dislodged SK, minor issue.   Cholesterol controlled.  ED      Plan:      Patient Instructions   See Dr. Daryll DrownPickup or a partner for the partially dislodged SK.    See [facility] for the palpitations.    OK to get a [employer] vascular screen - good value.    Continue same medications.    Return in 6 months for a yearly physical exam.

## 2014-04-02 NOTE — Patient Instructions (Signed)
See Dr. Daryll DrownPickup or a partner for the partially dislodged SK.    See [facility] for the palpitations.    OK to get a [employer] vascular screen - good value.    Continue same medications.    Return in 6 months for a yearly physical exam.

## 2014-04-06 ENCOUNTER — Encounter: Primary: Internal Medicine

## 2014-04-09 ENCOUNTER — Encounter: Attending: Internal Medicine | Primary: Internal Medicine

## 2014-05-09 ENCOUNTER — Ambulatory Visit
Admit: 2014-05-09 | Discharge: 2014-05-09 | Payer: MEDICARE | Attending: Cardiovascular Disease | Primary: Internal Medicine

## 2014-05-09 DIAGNOSIS — R002 Palpitations: Secondary | ICD-10-CM

## 2014-05-09 NOTE — Patient Instructions (Signed)
Palpitations: Care Instructions  Your Care Instructions     Heart palpitations are the uncomfortable sensation that your heart is beating fast or irregularly. You might feel pounding or fluttering in your chest. It might feel like your heart is skipping a beat.  Although palpitations may be caused by a heart problem, they also occur because of stress, fatigue, or use of alcohol, caffeine, or nicotine. Many medicines, including diet pills, antihistamines, decongestants, and some herbal products, can cause heart palpitations. Nearly everyone has palpitations from time to time.  Depending on your symptoms, your doctor may need to do more tests to try to find the cause of your palpitations.  Follow-up care is a key part of your treatment and safety. Be sure to make and go to all appointments, and call your doctor if you are having problems. It's also a good idea to know your test results and keep a list of the medicines you take.  How can you care for yourself at home?  ?? Avoid caffeine, nicotine, and excess alcohol.  ?? Do not take illegal drugs, such as methamphetamines and cocaine.  ?? Do not take weight loss or diet medicines unless you talk with your doctor first.  ?? Get plenty of sleep.  ?? Do not overeat.  ?? If you have palpitations again, take deep breaths and try to relax. This may slow a racing heart.  ?? If you start to feel lightheaded, lie down to avoid injuries that might result if you pass out and fall down.  ?? Keep a record of your palpitations and bring it to your next doctor's appointment. Write down:  ?? The date and time.  ?? Your pulse. (If your heart is beating fast, it may be hard to count your pulse.)  ?? What you were doing when the palpitations started.  ?? How long the palpitations lasted.  ?? Any other symptoms.  ?? If an activity causes palpitations, slow down or stop. Talk to your doctor before you do that activity again.  ?? Take your medicines exactly as prescribed. Call your doctor if you think  you are having a problem with your medicine.  When should you call for help?  Call 911 anytime you think you may need emergency care. For example, call if:  ?? You passed out (lost consciousness).  ?? You have symptoms of a heart attack. These may include:  ?? Chest pain or pressure, or a strange feeling in the chest.  ?? Sweating.  ?? Shortness of breath.  ?? Pain, pressure, or a strange feeling in the back, neck, jaw, or upper belly or in one or both shoulders or arms.  ?? Lightheadedness or sudden weakness.  ?? A fast or irregular heartbeat.  After you call 911, the operator may tell you to chew 1 adult-strength or 2 to 4 low-dose aspirin. Wait for an ambulance. Do not try to drive yourself.  ?? You have signs of a stroke. These may include:  ?? Sudden numbness, paralysis, or weakness in your face, arm, or leg, especially on only one side of your body.  ?? New problems with walking or balance.  ?? Sudden vision changes.  ?? Drooling or slurred speech.  ?? New problems speaking or understanding simple statements, or feeling confused.  ?? A sudden, severe headache that is different from past headaches.  Call your doctor now or seek immediate medical care if:  ?? You have heart palpitations and:  ?? Are dizzy or lightheaded, or you   feel like you may faint.  ?? Have new or increased shortness of breath.  Watch closely for changes in your health, and be sure to contact your doctor if:  ?? You continue to have heart palpitations.   Where can you learn more?   Go to [email or web address] and sign in to your MyChart account. Enter R508 in the [place] box to learn more about ???Palpitations: Care Instructions.???    If you do not have an account, please click on the ???Sign Up Now??? link.     ?? 2006-2015 [employer], Inc[employer]. Care instructions adapted under license by [facility]. This care instruction is for use with your licensed healthcare professional. If you have questions about a medical condition or  this instruction, always ask your healthcare professional. [place] any warranty or liability for your use of this information.  Content Version: 10.6.465758; Current as of: May 11, 2013              Transthoracic Echocardiogram: About This Test  What is it?     An echocardiogram (also called an echo) uses sound waves to make an image of your heart. A device called a transducer sends sound waves that echo off your heart and back to the transducer. These echoes are turned into moving pictures of your heart that can be seen on a video screen.  In a transthoracic echocardiogram (TTE), the transducer is moved across your chest or belly. A TTE is the most common type of echocardiogram.  Why is this test done?  This test is done to check your heart health. It's used for many reasons. Your doctor may do an echocardiogram to:  ?? Check a heart murmur.  ?? Look for the cause of shortness of breath or unexplained chest pain or pressure.  ?? Check how well your heart is pumping blood.  ?? Check to see how well your heart valves are working.  ?? Look for blood clots inside your heart.  What happens during the test?  ?? You will remove your clothes above your waist. You may be given a cloth or paper covering to use during the test.  ?? You will lie on your back or on your left side on a bed or table.  ?? You may receive medicine through a vein (intravenously, or IV). The IV can be used to give you a contrast material, which helps your doctor get good views of your heart.  ?? Small pads or patches (electrodes) will be taped to your arms and legs to record your heart rate during the test.  ?? A small amount of gel will be rubbed on the side of your chest to help pick up the sound waves.  ?? The transducer will be pressed firmly against your chest and moved slowly back and forth. It is usually moved to different areas on your chest or belly to get specific views of your heart.  ?? You will be asked to do several  things, such as hold very still, breathe in and out very slowly, hold your breath, or lie on your left side.  This test usually takes 30 to 60 minutes.  What else should you know about the test?  ?? You will not have any pain from an echocardiogram. You may have a brief, sharp pain if an intravenous (IV) needle is placed in a vein in your arm.  ?? No electricity passes through your body during the test. There is no danger of getting  an electrical shock.  ?? You do not receive any radiation.  What happens after the test?  ?? You will probably be able to go home right away.  ?? You can go back to your usual activities right away.  Follow-up care is a key part of your treatment and safety. Be sure to make and go to all appointments, and call your doctor if you are having problems. It's also a good idea to keep a list of the medicines you take. Ask your doctor when you can expect to have your test results.   Where can you learn more?   Go to [email or web address] and sign in to your MyChart account. Enter E130 in the Search Health Information box to learn more about ???Transthoracic Echocardiogram: About This Test.???    If you do not have an account, please click on the ???Sign Up Now??? link.     ?? 2006-2015 Healthwise, Incorporated. Care instructions adapted under license by [facility]. This care instruction is for use with your licensed healthcare professional. If you have questions about a medical condition or this instruction, always ask your healthcare professional. Healthwise, Incorporated disclaims any warranty or liability for your use of this information.  Content Version: 10.6.465758; Current as of: March 10, 2013              Holter Monitoring: About This Test  What is it?  A Holter monitor is a small machine that records the electrical activity of your heart. You wear it for 24 to 72 hours while you do all your normal activities.  The monitor has wires that attach to small electrode discs. These  discs are taped to your chest.  This kind of machine has many different names. It is sometimes called an ambulatory monitor, an ambulatory electrocardiogram, or an ambulatory EKG. It can also be called a 24-hour EKG or a cardiac event monitor.  Why is this test done?  You may have this test to find out if you have a problem with your heart. Many heart problems can only be noticed when you are doing something. They may happen when you exercise, eat, have sex, or sleep. Or they may happen when you have a bowel movement or you feel stressed. Your Holter monitor will record the way your heart beats during all of these activities.  Holter monitoring also will:  ?? Look for what may cause chest pain, dizziness, or fainting.  ?? Look for poor blood flow to your heart. This is called ischemia.  ?? Check to see if treatment for an irregular heartbeat is working.  How can you prepare for the test?  ?? Before the test, talk to your doctor about all your health problems. Tell him or her about all the medicines and vitamins you take.  ?? Take a shower or bath before the discs are put onto your chest. You must not get the discs wet during the test.  ?? Wear a loose blouse or shirt.  ?? Do not wear jewelry or clothes with metal buttons or buckles.  ?? If you are a woman, do not wear an underwire bra.  What happens before the test?  ?? Areas of your chest may be shaved and cleaned.  ?? The electrode discs are attached to your chest with a paste or gel.  ?? You wear the monitor on a strap over your shoulder or around your waist. It uses batteries and does not weigh much.  What happens during the test?  ??  You need to record your activities and symptoms. You will write down the time your symptoms started. And you will write down what type of activity you were doing.  ?? You can use the clock on the monitor to help you keep track of the time your symptoms started.  ?? When you sleep, try to stay on your back with the monitor at your side. This will  prevent the discs from coming off.  What else should you know about the test?  ?? When you wear the monitor, try to stay away from magnets, metal detectors, high-voltage areas, garage door openers, microwave ovens, and electric blankets.  ?? Do not use an electric toothbrush or shaver.  ?? The discs may make your skin itch a little. And your skin may look or feel irritated after the discs are removed.  How long does the test take?  ?? You usually wear the monitor for 24 to 72 hours.  What happens after the test?  ?? You may return to the doctor's office or hospital to have the discs removed. Or you may be able to take them off yourself.  ?? You will return the Holter monitor to your doctor's office or hospital.  ?? You can go back to your usual activities right away.   Where can you learn more?   Go to [email or web address] and sign in to your MyChart account. Enter [id number] in the Search Health Information box to learn more about ???Holter Monitoring: About This Test.???    If you do not have an account, please click on the ???Sign Up Now??? link.     ?? 2006-2015 Healthwise, Incorporated. Care instructions adapted under license by [facility]. This care instruction is for use with your licensed healthcare professional. If you have questions about a medical condition or this instruction, always ask your healthcare professional. Healthwise, Incorporated disclaims any warranty or liability for your use of this information.  Content Version: 10.6.465758; Current as of: March 10, 2013

## 2014-05-09 NOTE — Progress Notes (Signed)
[facility]    Brandon Powell  01/31/1945    May 09, 2014    Reason for Consult:  Palpitations    HPI:  The patient is 68 y.o. male with a past medical history significant for untreated OSA and hyperlipidemia who presents to the office for the evaluation of palpitations.     [facility] prior to his deployment to [place] in 2007. That cardiologist ordered a stress test and another one in 2008 that were reportedly normal. He was also tested for sleep apnea at that time and he reports the results were normal. Since then he had experienced another episode of palpitations when getting out of the shower 2 weeks ago that lasted for 2 hours. He reports when he got on the plane for work the palpitations stopped. The most recent event was a week ago and he had some associated chest pain that came on at rest. He denies any shortness of breath. He experienced 2 episodes of palpitations last week and before that it was a month when he had the palpitations.    He exercises weekly by doing strength training without any limitations. He goes up and down two sets of stairs at work several times a day. 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     Review of Systems:  Denies any PND or orthopnea. Denies any cough productive or otherwise. No recent change in weight. No recent changes in bowel or bladder habits. No easy bleeding or bruising. Denies any arthralgias or myalgias. Review of systems is negative to a 12 point review except as noted above.     Past Medical History   Diagnosis Date   ??? Cholelithiasis      with acute cholecystitis   ??? Disorders of bursae and tendons in shoulder region, unspecified      tendinitis, right shoulder   ??? Schatzki's ring 11/2006   ??? Dysphagia      unspecified   ??? Abnormal PSA      increased   ??? Peripheral Neuropathy      feet   ??? Erectile dysfunction    ??? Hypercholesterolemia    ??? GERD (gastroesophageal reflux  disease)    ??? PVC's    ??? History of cholecystectomy 3/10     lap- Dr. Bradley   ??? OSA (obstructive sleep apnea) 07/28/2010     7/12 Pt saw Dr. Manders for OSA, is considering his options.  9/14 He has decided to not pursue any further treatment. He does not fall asleep during the day, feels rested when he awakens, has not been observed to stop breathing for long periods when he sleeps.    ??? Dysphagia 11/15/2008     2008 esophagram, HH, reflux, Schatzki's ring - no treatment indicated since only mildly symptomatic 12/11 Dr. Saeed dilated a Schatzki's ring. 1/12 Seen by Dr. Bekal for persistent dysphagia; he plans to repeat EGD with dilitation      Past Surgical History   Procedure Laterality Date   ??? Colonoscopy  11/08     Dr. saeed, divertics only, repeat 2018    ??? Upper gastrointestinal endoscopy  12/11     Dr. saeed dilated a Schatzki's ring    ??? Upper gastrointestinal endoscopy  1/12     Dr. Bekal dilated Schatzki's ring with a balloon     Family History: No family history of CAD or sudden cardiac   death.    Problem Relation Age of Onset   ??? Hypertension Father    ??? Heart Disease Father 89     Died of MI, probably also CHF   ??? Diabetes Sister      DM     History   Substance Use Topics   ??? Smoking status: Never Smoker    ??? Smokeless tobacco: Never Used   ??? Alcohol Use: 0.0 oz/week     1-2 Glasses of wine per week      Comment: 1-2 drinks per week       Allergies   Allergen Reactions   ??? Sulfa Antibiotics      Critical reaction- Hallucinations     Current Outpatient Prescriptions   Medication Sig Dispense Refill   ??? sildenafil (REVATIO) 20 MG tablet Take 2-3 tablets as needed for ED 60 tablet 5   ??? simvastatin (ZOCOR) 20 MG tablet TAKE 1 TABLET EVERY EVENING 90 tablet 3   ??? aspirin 81 MG tablet Take 81 mg by mouth daily.     ??? loratadine (CLARITIN) 10 MG tablet Take 1 tablet by mouth daily. 90 tablet 1   ??? IBUPROFEN by Does not apply route.       ??? mometasone (NASONEX) 50 MCG/ACT nasal spray 2 sprays by Nasal route  daily.       No current facility-administered medications for this visit.       Physical Exam:   BP 120/70 mmHg   Pulse 60   Ht 5' 10" (1.778 m)   Wt 205 lb (92.987 kg)   BMI 29.41 kg/m2   SpO2 98%  No intake or output data in the 24 hours ending 05/09/14 1017  Wt Readings from Last 2 Encounters:   05/09/14 205 lb (92.987 kg)   04/02/14 205 lb 6.4 oz (93.169 kg)     Constitutional: He is oriented to person, place, and time. He appears well-developed and well-nourished. In no acute distress.   Head: Normocephalic and atraumatic.     Neck: Neck supple. No JVD present. Carotid bruit is not present. No mass and no thyromegaly present. No lymphadenopathy present.  Cardiovascular: Normal rate, regular rhythm, normal heart sounds and intact distal pulses.  Exam reveals no gallop and no friction rub.  No murmur heard.  Pulmonary/Chest: Effort normal and breath sounds normal. No respiratory distress. He has no wheezes, rhonchi or rales.   Abdominal: Soft, non-tender. Bowel sounds and aorta are normal. He exhibits no organomegaly, mass or bruit.   Extremities: No edema, cyanosis, or clubbing. Pulses are 2+ radial/carotid/dorsalis pedis and posterior tibial bilaterally.  Neurological: He is alert and oriented to person, place, and time. He has normal reflexes. No cranial nerve deficit. Coordination normal.   Skin: Skin is warm and dry. There is no rash or diaphoresis.   Psychiatric: He has a normal mood and affect. His speech is normal and behavior is normal.     EKG Interpretation: Sinus bradycardia    Lab Review:   Lab Results   Component Value Date    TRIG 105 03/30/2014    HDL 39 03/30/2014    HDL 37 05/05/2010    LDLCALC 103 03/30/2014    LDLDIRECT 130 06/07/2009    LABVLDL 21 03/30/2014     Lab Results   Component Value Date    NA 142 03/30/2014    K 4.5 03/30/2014    BUN 14 03/30/2014    CREATININE 0.8 03/30/2014           Assessment:  1. Palpitations  2. Chest pain, atypical  3. Hyperlipidemia with goal  LDL<130gm/dL    Plan:  Brandon Powell and I spent some time discussing his symptoms. I think tat his chest pain is atypical for anginal pain. This occurred at rest and he has no exertional angina. His ECG is unremarkable for ischemia. I don't think U would work this up at this point. His palpitations are likely benign premature beats but they seems to be concerning him. I will order an Event mMnitor to assess for any arrhythmias and an hocardiogram to assess LV his function and wall motion. I will see him back in the follow up in the office in 6-8 weeks.

## 2014-05-09 NOTE — Telephone Encounter (Signed)
Dr. Fransico MichaelBrennan ordered pt to have a [employer] monitor.  Patient was given brief instructions and expressed understanding.     Patient has been enrolled through [employer].

## 2014-05-14 ENCOUNTER — Encounter: Primary: Internal Medicine

## 2014-05-16 ENCOUNTER — Inpatient Hospital Stay: Admit: 2014-05-16 | Attending: Internal Medicine | Primary: Internal Medicine

## 2014-05-16 LAB — ECHOCARDIOGRAM COMPLETE 2D W DOPPLER W COLOR: Left Ventricular Ejection Fraction: 55

## 2014-05-19 NOTE — Communication Body (Signed)
[facility]    Daneen SchickLawrence V Lees  12/24/1945    May 09, 2014    Reason for Consult:  Palpitations    HPI:  The patient is 69 y.o. male with a past medical history significant for untreated OSA and hyperlipidemia who presents to the office for the evaluation of palpitations.     He has seen a cardiologist at the [facility] in the past for this prior to his deployment to [place] in 2007. That cardiologist ordered a stress test and another one in 2008 that were reportedly normal. He was also tested for sleep apnea at that time and he reports the results were normal. Since then he had experienced another episode of palpitations when getting out of the shower 2 weeks ago that lasted for 2 hours. He reports when he got on the plane for work the palpitations stopped. The most recent event was a week ago and he had some associated chest pain that came on at rest. He denies any shortness of breath. He experienced 2 episodes of palpitations last week and before that it was a month when he had the palpitations.    He exercises weekly by doing strength training without any limitations. He goes up and down two sets of stairs at work several times a day. 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     Review of Systems:  Denies any PND or orthopnea. Denies any cough productive or otherwise. No recent change in weight. No recent changes in bowel or bladder habits. No easy bleeding or bruising. Denies any arthralgias or myalgias. Review of systems is negative to a 12 point review except as noted above.     Past Medical History   Diagnosis Date   ??? Cholelithiasis      with acute cholecystitis   ??? Disorders of bursae and tendons in shoulder region, unspecified      tendinitis, right shoulder   ??? Schatzki's ring 11/2006   ??? Dysphagia      unspecified   ??? Abnormal PSA      increased   ??? Peripheral Neuropathy      feet   ??? Erectile dysfunction    ??? Hypercholesterolemia    ??? GERD (gastroesophageal reflux  disease)    ??? PVC's    ??? History of cholecystectomy 3/10     lap- Dr. Elige RadonBradley   ??? OSA (obstructive sleep apnea) 07/28/2010     7/12 Pt saw Dr. Vanice SarahManders for OSA, is considering his options.  9/14 He has decided to not pursue any further treatment. He does not fall asleep during the day, feels rested when he awakens, has not been observed to stop breathing for long periods when he sleeps.    ??? Dysphagia 11/15/2008     2008 esophagram, HH, reflux, Schatzki's ring - no treatment indicated since only mildly symptomatic 12/11 Dr. Roselle LocusSaeed dilated a Schatzki's ring. 1/12 Seen by Dr. Augusto GambleBekal for persistent dysphagia; he plans to repeat EGD with dilitation      Past Surgical History   Procedure Laterality Date   ??? Colonoscopy  11/08     Dr. Roselle Locussaeed, divertics only, repeat 2018    ??? Upper gastrointestinal endoscopy  12/11     Dr. Roselle Locussaeed dilated a Schatzki's ring    ??? Upper gastrointestinal endoscopy  1/12     Dr. Augusto GambleBekal dilated Schatzki's ring with a balloon     Family History: No family history of CAD or sudden cardiac  death.    Problem Relation Age of Onset   ??? Hypertension Father    ??? Heart Disease Father 38     Died of MI, probably also CHF   ??? Diabetes Sister      DM     History   Substance Use Topics   ??? Smoking status: Never Smoker    ??? Smokeless tobacco: Never Used   ??? Alcohol Use: 0.0 oz/week     1-2 Glasses of wine per week      Comment: 1-2 drinks per week       Allergies   Allergen Reactions   ??? Sulfa Antibiotics      Critical reaction- Hallucinations     Current Outpatient Prescriptions   Medication Sig Dispense Refill   ??? sildenafil (REVATIO) 20 MG tablet Take 2-3 tablets as needed for ED 60 tablet 5   ??? simvastatin (ZOCOR) 20 MG tablet TAKE 1 TABLET EVERY EVENING 90 tablet 3   ??? aspirin 81 MG tablet Take 81 mg by mouth daily.     ??? loratadine (CLARITIN) 10 MG tablet Take 1 tablet by mouth daily. 90 tablet 1   ??? IBUPROFEN by Does not apply route.       ??? mometasone (NASONEX) 50 MCG/ACT nasal spray 2 sprays by Nasal route  daily.       No current facility-administered medications for this visit.       Physical Exam:   BP 120/70 mmHg   Pulse 60   Ht  (1.778 m)   Wt 205 lb (92.987 kg)   BMI 29.41 kg/m2   SpO2 98%  No intake or output data in the 24 hours ending 05/09/14 1017  Wt Readings from Last 2 Encounters:   05/09/14 205 lb (92.987 kg)   04/02/14 205 lb 6.4 oz (93.169 kg)     Constitutional: He is oriented to person, place, and time. He appears well-developed and well-nourished. In no acute distress.   Head: Normocephalic and atraumatic.     Neck: Neck supple. No JVD present. Carotid bruit is not present. No mass and no thyromegaly present. No lymphadenopathy present.  Cardiovascular: Normal rate, regular rhythm, normal heart sounds and intact distal pulses.  Exam reveals no gallop and no friction rub.  No murmur heard.  Pulmonary/Chest: Effort normal and breath sounds normal. No respiratory distress. He has no wheezes, rhonchi or rales.   Abdominal: Soft, non-tender. Bowel sounds and aorta are normal. He exhibits no organomegaly, mass or bruit.   Extremities: No edema, cyanosis, or clubbing. Pulses are 2+ radial/carotid/dorsalis pedis and posterior tibial bilaterally.  Neurological: He is alert and oriented to person, place, and time. He has normal reflexes. No cranial nerve deficit. Coordination normal.   Skin: Skin is warm and dry. There is no rash or diaphoresis.   Psychiatric: He has a normal mood and affect. His speech is normal and behavior is normal.     EKG Interpretation: Sinus bradycardia    Lab Review:   Lab Results   Component Value Date    TRIG 105 03/30/2014    HDL 39 03/30/2014    HDL 37 05/05/2010    LDLCALC 103 03/30/2014    LDLDIRECT 130 06/07/2009    LABVLDL 21 03/30/2014     Lab Results   Component Value Date    NA 142 03/30/2014    K 4.5 03/30/2014    BUN 14 03/30/2014    CREATININE 0.8 03/30/2014  Assessment:  1. Palpitations  2. Chest pain, atypical  3. Hyperlipidemia with goal  LDL<130gm/dL    Plan:  Lyman Bishop and I spent some time discussing his symptoms. I think tat his chest pain is atypical for anginal pain. This occurred at rest and he has no exertional angina. His ECG is unremarkable for ischemia. I don't think U would work this up at this point. His palpitations are likely benign premature beats but they seems to be concerning him. I will order an Event mMnitor to assess for any arrhythmias and an hocardiogram to assess LV his function and wall motion. I will see him back in the follow up in the office in 6-8 weeks.

## 2014-06-14 NOTE — Telephone Encounter (Signed)
PT. CALLED STATING THAT HE THOUGHT HE WAS SUPPOSE TO GET A CALL ABOUT A REFERRAL TO AN   ENT.   PT. WOULD LIKE SOMEONE TO CALL HIM BACK.   [phone]  TOMORROW

## 2014-06-15 NOTE — Telephone Encounter (Signed)
In the OV of 04/02/14, Lowella FairyJoe Hellmann was mentioned.  His number is [phone].  Patient informed.

## 2014-06-20 ENCOUNTER — Encounter: Payer: MEDICARE | Attending: Cardiovascular Disease | Primary: Internal Medicine

## 2014-06-20 NOTE — Progress Notes (Signed)
This encounter was created in error - please disregard.

## 2014-07-02 ENCOUNTER — Ambulatory Visit
Admit: 2014-07-02 | Discharge: 2014-07-02 | Payer: MEDICARE | Attending: Cardiovascular Disease | Primary: Internal Medicine

## 2014-07-02 DIAGNOSIS — I480 Paroxysmal atrial fibrillation: Secondary | ICD-10-CM

## 2014-07-02 NOTE — Patient Instructions (Addendum)
Palpitations: Care Instructions  Your Care Instructions     Heart palpitations are the uncomfortable sensation that your heart is beating fast or irregularly. You might feel pounding or fluttering in your chest. It might feel like your heart is skipping a beat.  Although palpitations may be caused by a heart problem, they also occur because of stress, fatigue, or use of alcohol, caffeine, or nicotine. Many medicines, including diet pills, antihistamines, decongestants, and some herbal products, can cause heart palpitations. Nearly everyone has palpitations from time to time.  Depending on your symptoms, your doctor may need to do more tests to try to find the cause of your palpitations.  Follow-up care is a key part of your treatment and safety. Be sure to make and go to all appointments, and call your doctor if you are having problems. It's also a good idea to know your test results and keep a list of the medicines you take.  How can you care for yourself at home?  ?? Avoid caffeine, nicotine, and excess alcohol.  ?? Do not take illegal drugs, such as methamphetamines and cocaine.  ?? Do not take weight loss or diet medicines unless you talk with your doctor first.  ?? Get plenty of sleep.  ?? Do not overeat.  ?? If you have palpitations again, take deep breaths and try to relax. This may slow a racing heart.  ?? If you start to feel lightheaded, lie down to avoid injuries that might result if you pass out and fall down.  ?? Keep a record of your palpitations and bring it to your next doctor's appointment. Write down:  ?? The date and time.  ?? Your pulse. (If your heart is beating fast, it may be hard to count your pulse.)  ?? What you were doing when the palpitations started.  ?? How long the palpitations lasted.  ?? Any other symptoms.  ?? If an activity causes palpitations, slow down or stop. Talk to your doctor before you do that activity again.  ?? Take your medicines exactly as prescribed. Call your doctor if you think  you are having a problem with your medicine.  When should you call for help?  Call 911 anytime you think you may need emergency care. For example, call if:  ?? You passed out (lost consciousness).  ?? You have symptoms of a heart attack. These may include:  ?? Chest pain or pressure, or a strange feeling in the chest.  ?? Sweating.  ?? Shortness of breath.  ?? Pain, pressure, or a strange feeling in the back, neck, jaw, or upper belly or in one or both shoulders or arms.  ?? Lightheadedness or sudden weakness.  ?? A fast or irregular heartbeat.  After you call 911, the operator may tell you to chew 1 adult-strength or 2 to 4 low-dose aspirin. Wait for an ambulance. Do not try to drive yourself.  ?? You have signs of a stroke. These may include:  ?? Sudden numbness, paralysis, or weakness in your face, arm, or leg, especially on only one side of your body.  ?? New problems with walking or balance.  ?? Sudden vision changes.  ?? Drooling or slurred speech.  ?? New problems speaking or understanding simple statements, or feeling confused.  ?? A sudden, severe headache that is different from past headaches.  Call your doctor now or seek immediate medical care if:  ?? You have heart palpitations and:  ?? Are dizzy or lightheaded, or you   feel like you may faint.  ?? Have new or increased shortness of breath.  Watch closely for changes in your health, and be sure to contact your doctor if:  ?? You continue to have heart palpitations.   Where can you learn more?   Go to [email or web address] and sign in to your MyChart account. Enter R508 in the Search Health Information box to learn more about ???Palpitations: Care Instructions.???    If you do not have an account, please click on the ???Sign Up Now??? link.     ?? 2006-2015 [employer]wise, Inc[employer]. Care instructions adapted under license by [facility]. This care instruction is for use with your licensed healthcare professional. If you have questions about a medical condition or  this instruction, always ask your healthcare professional. Healthwise, Incorporated disclaims any warranty or liability for your use of this information.  Content Version: 10.6.465758; Current as of: May 11, 2013              Atrial Fibrillation: Care Instructions  Your Care Instructions     Atrial fibrillation is an irregular and often fast heartbeat. Treating this condition is important for several reasons. It can cause blood clots, which can travel from your heart to your brain and cause a stroke. If you have a fast heartbeat, you may feel lightheaded, dizzy, and weak. An irregular heartbeat can also increase your risk for heart failure.  Atrial fibrillation is often the result of another heart condition, such as high blood pressure or coronary artery disease. Making changes to improve your heart condition will help you stay healthy and active.  Follow-up care is a key part of your treatment and safety. Be sure to make and go to all appointments, and call your doctor if you are having problems. It's also a good idea to know your test results and keep a list of the medicines you take.  How can you care for yourself at home?  Medicines  ?? Take your medicines exactly as prescribed. Call your doctor if you think you are having a problem with your medicine. You will get more details on the specific medicines your doctor prescribes.  ?? If your doctor has given you a blood thinner to prevent a stroke, be sure you get instructions about how to take your medicine safely. Blood thinners can cause serious bleeding problems.  ?? Do not take any vitamins, over-the-counter drugs, or herbal products without talking to your doctor first.  Lifestyle changes  ?? Do not smoke. Smoking can increase your chance of a stroke and heart attack. If you need help quitting, talk to your doctor about stop-smoking programs and medicines. These can increase your chances of quitting for good.  ?? Eat a heart-healthy diet.  ?? Limit alcohol to 2  drinks a day for men and 1 drink a day for women. Too much alcohol can cause health problems.  ?? Avoid colds and flu. Get a pneumococcal vaccine shot. If you have had one before, ask your doctor whether you need another dose. Get a flu shot every year. If you must be around people with colds or flu, wash your hands often.  Activity  ?? If your doctor recommends it, get more exercise. Walking is a good choice. Bit by bit, increase the amount you walk every day. Try for at least 30 minutes on most days of the week. You also may want to swim, bike, or do other activities. Your doctor may suggest that you join a cardiac rehabilitation  program so that you can have help increasing your physical activity safely.  ?? Start light exercise if your doctor says it is okay. Even a small amount will help you get stronger, have more energy, and manage stress. Walking is an easy way to get exercise. Start out by walking a little more than you did in the hospital. Gradually increase the amount you walk.  ?? When you exercise, watch for signs that your heart is working too hard. You are pushing too hard if you cannot talk while you are exercising. If you become short of breath or dizzy or have chest pain, sit down and rest immediately.  ?? Check your pulse regularly. Place two fingers on the artery at the palm side of your wrist, in line with your thumb. If your heartbeat seems uneven or fast, talk to your doctor.  When should you call for help?  Call 911 anytime you think you may need emergency care. For example, call if:  ?? You have symptoms of a heart attack. These may include:  ?? Chest pain or pressure, or a strange feeling in the chest.  ?? Sweating.  ?? Shortness of breath.  ?? Nausea or vomiting.  ?? Pain, pressure, or a strange feeling in the back, neck, jaw, or upper belly or in one or both shoulders or arms.  ?? Lightheadedness or sudden weakness.  ?? A fast or irregular heartbeat.  After you call 911, the operator may tell you to  chew 1 adult-strength or 2 to 4 low-dose aspirin. Wait for an ambulance. Do not try to drive yourself.  ?? You have signs of a stroke. These may include:  ?? Sudden numbness, paralysis, or weakness in your face, arm, or leg, especially on only one side of your body.  ?? New problems with walking or balance.  ?? Sudden vision changes.  ?? Drooling or slurred speech.  ?? New problems speaking or understanding simple statements, or feeling confused.  ?? A sudden, severe headache that is different from past headaches.  ?? You passed out (lost consciousness).  Call your doctor now or seek immediate medical care if:  ?? You have new or increased shortness of breath.  ?? You feel dizzy or lightheaded, or you feel like you may faint.  ?? Your heart rate becomes irregular.  ?? You can feel your heart flutter in your chest or skip heartbeats. Tell your doctor if these symptoms are new or worse.  Watch closely for changes in your health, and be sure to contact your doctor if you have any problems.   Where can you learn more?   Go to [email or web address] and sign in to your MyChart account. Enter U020 in the Search Health Information box to learn more about ???Atrial Fibrillation: Care Instructions.???    If you do not have an account, please click on the ???Sign Up Now??? link.     ?? 2006-2015 [employer]wise, Inc[employer]. Care instructions adapted under license by [facility]. This care instruction is for use with your licensed healthcare professional. If you have questions about a medical condition or this instruction, always ask your healthcare professional. Healthwise, Incorporated disclaims any warranty or liability for your use of this information.  Content Version: 10.6.465758; Current as of: Jun 16, 2013              Deciding Between Electrical Cardioversion and Rate Control Medicines for Atrial Fibrillation  What is atrial fibrillation?     Atrial fibrillation (say "AY-tree-uhl fih-bruh-LAY-shun") is  a kind of uneven  heartbeat. It can make you feel lightheaded and dizzy. You may feel weak. It also can make you more likely to have a stroke.  Electrical cardioversion can return your heart to a normal rhythm. First you'll get medicines to make you sleepy and control pain. Then your doctor will use patches or paddles to send an electric current to your heart. This resets the rhythm of your heart.  Not everyone with atrial fibrillation needs this treatment. For some people, taking medicines may be better. Most people can live with an uneven heartbeat. It just has to be kept under control so the heart does not beat too fast.  Use this information to help you and your doctor decide which treatment to choose for atrial fibrillation.  What are key points about this decision?  ?? Electrical cardioversion can return your heart to a normal rhythm. But the problem can come back. The longer you have had atrial fibrillation, the more likely it is to come back after this treatment.  ?? Cardioversion may not work as well when an uneven heartbeat is caused by another heart disease, such as heart failure.  ?? If your symptoms bother you a lot, you may want to try cardioversion. But even if it works, you may still need to take blood thinners to prevent a stroke.  ?? If you don't have symptoms, or if they don't bother you much, you can try medicines to slow your heart rate. And you can take blood thinners to prevent a stroke.  ?? Cardioversion does have risks, such as stroke. Discuss the risks with your doctor. Make sure you understand them.  ?? You may have more than one heart problem. Cardioversion doesn't work as well if you have more than one heart problem.  Why might you choose electrical cardioversion?  ?? It restores the normal heart rhythm for most people.  ?? The idea of having an electric shock does not bother you.  ?? Your symptoms bother you a lot.  ?? You have had atrial fibrillation just one time.  ?? You do not have other heart problems.  ?? You  may not have to take as many medicines. Or you may not need to take them as long.  Why might you choose rate-control medicines?  ?? These medicines keep many people from having symptoms.  ?? You prefer to take medicines rather than have an electric shock.  ?? Your symptoms don't bother you much.  ?? If these medicines don't work, you can still try electrical cardioversion.  Your decision  Thinking about the facts and your feelings can help you make a decision that is right for you. Be sure you understand the benefits and risks of your options. And think about what else you need to do before you make the decision.   Where can you learn more?   Go to [email or web address] and sign in to your MyChart account. Enter [phone] in the Search Health Information box to learn more about ???Deciding Between Electrical Cardioversion and Rate Control Medicines for Atrial Fibrillation.???    If you do not have an account, please click on the ???Sign Up Now??? link.     ?? 2006-2015 Healthwise, Inc[employer]. Care instructions adapted under license by [facility]. This care instruction is for use with your licensed healthcare professional. If you have questions about a medical condition or this instruction, always ask your healthcare professional. Healthwise, Inc[employer] disclaims any warranty or liability for your use of this  information.  Content Version: 10.6.465758; Current as of: March 10, 2013

## 2014-07-02 NOTE — Progress Notes (Signed)
[facility]    Brandon Powell  1945-04-01    July 02, 2014    The patient is 69 y.o. male with a past medical history significant for untreated OSA and hyperlipidemia who presents to the office for the evaluation of palpitations. He has seen a cardiologist at the [place] in the past for this prior to his deployment to [place] in 2007. That cardiologist ordered a stress test and another one in 2008 that were reportedly normal. He was also tested for sleep apnea at that time and he reports the results were normal. Since then he had experienced another episode of palpitations when getting out of the shower 2 weeks ago that lasted for 2 hours. He reports when he got on the plane for work the palpitations stopped. The most recent event was a week ago and he had some associated chest pain that came on at rest. He denies any shortness of breath. He experienced 2 episodes of palpitations last week and before that it was a month when he had the palpitations. He exercises weekly by doing strength training without any limitations. He goes up and down two sets of stairs at work several times a day. 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       Review of Systems:  Denies any PND or orthopnea. Denies any cough productive or otherwise. No recent change in weight. No recent changes in bowel or bladder habits. No easy bleeding or bruising. Denies any arthralgias or myalgias. Review of systems is negative to a 12 point review except as noted above.     History   Substance Use Topics   ??? Smoking status: Never Smoker    ??? Smokeless tobacco: Never Used   ??? Alcohol Use: 0.0 oz/week     1-2 Glasses of wine per week      Comment: 1-2 drinks per week       Allergies   Allergen Reactions   ??? Sulfa Antibiotics      Critical reaction- Hallucinations     Current Outpatient Prescriptions   Medication Sig Dispense Refill   ??? sildenafil (REVATIO) 20 MG tablet Take 2-3 tablets as needed for ED 60 tablet 5   ???  simvastatin (ZOCOR) 20 MG tablet TAKE 1 TABLET EVERY EVENING 90 tablet 3   ??? aspirin 81 MG tablet Take 81 mg by mouth daily.     ??? loratadine (CLARITIN) 10 MG tablet Take 1 tablet by mouth daily. 90 tablet 1   ??? IBUPROFEN by Does not apply route.       ??? mometasone (NASONEX) 50 MCG/ACT nasal spray 2 sprays by Nasal route daily.       No current facility-administered medications for this visit.       Physical Exam:   BP 122/72 mmHg   Pulse 71   Ht  (1.778 m)   Wt 205 lb 6.4 oz (93.169 kg)   BMI 29.47 kg/m2   SpO2 98%  No intake or output data in the 24 hours ending 07/02/14 1254  Wt Readings from Last 2 Encounters:   07/02/14 205 lb 6.4 oz (93.169 kg)   05/09/14 205 lb (92.987 kg)     Constitutional: He is oriented to person, place, and time. He appears well-developed and well-nourished. In no acute distress.   Head: Normocephalic and atraumatic.     Neck: Neck supple. No JVD present. Carotid bruit is not present. No mass and no thyromegaly present.  No lymphadenopathy present.  Cardiovascular: Normal rate, regular rhythm, normal heart sounds and intact distal pulses.  Exam reveals no gallop and no friction rub.  No murmur heard.  Pulmonary/Chest: Effort normal and breath sounds normal. No respiratory distress. He has no wheezes, rhonchi or rales.   Abdominal: Soft, non-tender. Bowel sounds and aorta are normal. He exhibits no organomegaly, mass or bruit.   Extremities: No edema, cyanosis, or clubbing. Pulses are 2+ radial/carotid/dorsalis pedis and posterior tibial bilaterally.  Neurological: He is alert and oriented to person, place, and time. He has normal reflexes. No cranial nerve deficit. Coordination normal.   Skin: Skin is warm and dry. There is no rash or diaphoresis.   Psychiatric: He has a normal mood and affect. His speech is normal and behavior is normal.     EKG Interpretation: Sinus bradycardia    Lab Review:   Lab Results   Component Value Date    TRIG 105 03/30/2014    HDL 39 03/30/2014    HDL  37 05/05/2010    LDLCALC 103 03/30/2014    LDLDIRECT 130 06/07/2009    LABVLDL 21 03/30/2014     Lab Results   Component Value Date    NA 142 03/30/2014    K 4.5 03/30/2014    BUN 14 03/30/2014    CREATININE 0.8 03/30/2014     Echo 05/17/14:  Normal left ventricle size, wall thickness and systolic function with an  estimated ejection fraction of 55%. No regional wall motion abnormalities  are seen.  Normal diastolic function.  There is mild tricuspid regurgitation with RVSP estimated at 29 mmHg.  Mild pulmonic regurgitation present.      Assessment:  1. Paroxysmal atrial fibrillation  2. Chest pain, atypical  3. Hyperlipidemia with goal LDL<130gm/dL    Plan:  I spent time discussing the event monitor results which showed episodes of Atrial Fibrillation.  He is mildly symptomatic with the episodes.  CHADS Vasc risk 0.6% per year for the risk of a CVA without anticoagulation.  Reviewed the importance of when anticoagulation therapy is recommended.  TSH is within normal limits.  He has underwent sleep study workup and a recent echocardiogram which are both unremarkable.  Educated Brandon Powell on medical management vs invasive therapy.  He wishes to avoid all therapies at this time since his symptoms are not limiting.  May consider initiating a calcium channel blocker in the future if warranted.  He declines this at present. I have asked him to call me if his symptoms worsen.  Otherwise, he can follow up in 1 year.

## 2014-08-02 MED ORDER — SILDENAFIL CITRATE 20 MG PO TABS
20 MG | ORAL_TABLET | ORAL | 5 refills | Status: DC
Start: 2014-08-02 — End: 2017-06-26

## 2014-08-02 NOTE — Telephone Encounter (Signed)
PT CALLING FOR REFILL ON CIALIS TO BE CALLED IN TO [facility] [phone]    I DO NOT SEE IT ON HIS MED LIST    LAST OFFICE VISIT = 04-02-14  NEXT VISIT IS FOR PE 10-22-14

## 2014-08-02 NOTE — Telephone Encounter (Signed)
Spoke with patient to let him know that we do not have Cialis on his med list.  Told him we have sildenafil and he said that works fine he would like that refilled.    Rx e-scribed.

## 2014-10-18 ENCOUNTER — Encounter

## 2014-10-19 ENCOUNTER — Encounter: Admit: 2014-10-19 | Discharge: 2014-10-19 | Payer: MEDICARE | Primary: Internal Medicine

## 2014-10-20 LAB — TSH: TSH: 2.52 u[IU]/mL (ref 0.27–4.20)

## 2014-10-20 LAB — CBC WITH AUTO DIFFERENTIAL
Basophils %: 0.6 %
Basophils Absolute: 0 10*3/uL (ref 0.0–0.2)
Eosinophils %: 5.4 %
Eosinophils Absolute: 0.3 10*3/uL (ref 0.0–0.6)
Hematocrit: 44.1 % (ref 40.5–52.5)
Hemoglobin: 15 g/dL (ref 13.5–17.5)
Lymphocytes %: 38.3 %
Lymphocytes Absolute: 2 10*3/uL (ref 1.0–5.1)
MCH: 33.2 pg (ref 26.0–34.0)
MCHC: 34 g/dL (ref 31.0–36.0)
MCV: 97.7 fL (ref 80.0–100.0)
MPV: 8.6 fL (ref 5.0–10.5)
Monocytes %: 11.2 %
Monocytes Absolute: 0.6 10*3/uL (ref 0.0–1.3)
Neutrophils %: 44.5 %
Neutrophils Absolute: 2.3 10*3/uL (ref 1.7–7.7)
Platelets: 214 10*3/uL (ref 135–450)
RBC: 4.51 M/uL (ref 4.20–5.90)
RDW: 13 % (ref 12.4–15.4)
WBC: 5.1 10*3/uL (ref 4.0–11.0)

## 2014-10-20 LAB — BASIC METABOLIC PANEL
Anion Gap: 13 (ref 3–16)
BUN: 11 mg/dL (ref 7–20)
CO2: 29 mmol/L (ref 21–32)
Calcium: 9.1 mg/dL (ref 8.3–10.6)
Chloride: 99 mmol/L (ref 99–110)
Creatinine: 0.8 mg/dL (ref 0.8–1.3)
GFR African American: >60 (ref >60)
GFR Non-African American: >60 (ref >60)
Glucose: 93 mg/dL (ref 70–99)
Potassium: 4.4 mmol/L (ref 3.5–5.1)
Sodium: 141 mmol/L (ref 136–145)

## 2014-10-20 LAB — LIPID PANEL
Cholesterol, Total: 160 mg/dL (ref 0–199)
HDL: 39 mg/dL — ABNORMAL LOW (ref 40–60)
LDL Calculated: 99 mg/dL (ref <100)
Triglycerides: 112 mg/dL (ref 0–150)
VLDL Cholesterol Calculated: 22 mg/dL

## 2014-10-20 LAB — HEPATITIS C ANTIBODY: Hep C Ab Interp: NONREACTIVE

## 2014-10-20 LAB — PSA SCREENING: PSA: 1.7 ng/mL (ref 0.00–4.00)

## 2014-10-22 ENCOUNTER — Ambulatory Visit: Admit: 2014-10-22 | Discharge: 2014-10-22 | Payer: MEDICARE | Attending: Internal Medicine | Primary: Internal Medicine

## 2014-10-22 DIAGNOSIS — E7800 Pure hypercholesterolemia, unspecified: Secondary | ICD-10-CM

## 2014-10-22 NOTE — Addendum Note (Signed)
Addended by: Lurlean Leyden on: 10/22/2014 04:26 PM     Modules accepted: Orders

## 2014-10-22 NOTE — Patient Instructions (Signed)
Continue same medications.     Have a recheck in 6 months (I will be retired.)

## 2014-10-22 NOTE — Progress Notes (Signed)
Subjective:      Patient ID: Brandon Powell is a 69 y.o. male.    HPI The patient is here for comprehensive evaluation of ongoing medical problems. The patient has been instructed in the benefits of a healthy diet, regular exercise and ideal body weight.    The patient is here for evaluation of dyslipidemia. The patient has been instructed in diet and exercise. His lipid profile is acceptable on a statin.    He checks an occasional home BP and it is good.      Review of Systems   Constitutional: Negative for appetite change and unexpected weight change.   HENT:        He saw Dr. Marlyce Huge and decided to not do anything about his uvula.   Respiratory: Negative for cough, chest tightness and shortness of breath.    Cardiovascular: Negative for chest pain, palpitations and leg swelling.   Gastrointestinal: Negative for abdominal pain.        Some chronic dysphagia. He tried 2 EGDs in 2011 - 2012 that didn't work. He lives OK with it.   Neurological: Negative for syncope and light-headedness.   All other systems reviewed and are negative.      Objective:   Physical Exam   Constitutional: He is oriented to person, place, and time. He appears well-developed. No distress.   Mildly overweight   HENT:   Head: Normocephalic and atraumatic.   Right Ear: External ear normal.   Left Ear: External ear normal.   Nose: Nose normal.   Mouth/Throat: Oropharynx is clear and moist. No oropharyngeal exudate.   Eyes: Conjunctivae and EOM are normal. Pupils are equal, round, and reactive to light. No scleral icterus.   Neck: Normal range of motion. Neck supple. No thyromegaly present.   Cardiovascular: Normal rate, regular rhythm, normal heart sounds and intact distal pulses.  Exam reveals no gallop and no friction rub.    No murmur heard.  No carotid bruits.   Pulmonary/Chest: Effort normal and breath sounds normal. No respiratory distress. He has no wheezes. He has no rales.   Abdominal: Soft. Bowel sounds are normal. He exhibits no  distension and no mass. There is no tenderness. There is no guarding.   Genitourinary: Rectum normal, prostate normal and penis normal. Rectal exam shows guaiac negative stool.   Musculoskeletal: Normal range of motion. He exhibits no edema or tenderness.   Lymphadenopathy:     He has no cervical adenopathy.   Neurological: He is alert and oriented to person, place, and time. He exhibits normal muscle tone. Coordination normal.   Skin: Skin is warm and dry. No rash noted. No erythema.   Psychiatric: He has a normal mood and affect. His behavior is normal. Judgment and thought content normal.   Vitals reviewed.      Assessment:      Patient Active Problem List    Diagnosis Date Noted   ??? Dysphagia 10/22/2014   ??? Paroxysmal atrial fibrillation (HCC) 07/02/2014   ??? Skin lesion of back 04/02/2014   ??? Obesity (BMI 30.0-34.9) 03/30/2011   ??? ED (erectile dysfunction) 11/15/2008   ??? Hypercholesterolemia      He appears stable. Parox A. Fib classified very low risk by Dr. Fransico Michael, not anticoagulated. Dysphagia is mild - moderate and stable, failed dilitation x 2.      Plan:      Patient Instructions   Continue same medications.     Have a recheck in 6 months (I  will be retired.)

## 2014-10-29 MED ORDER — SIMVASTATIN 20 MG PO TABS
20 MG | ORAL_TABLET | ORAL | 1 refills | Status: DC
Start: 2014-10-29 — End: 2015-04-25

## 2014-10-29 NOTE — Telephone Encounter (Signed)
LAST SEEN       NEXT APPOINTMENT       LAST FILL    10.3.16  None   7.13.16

## 2015-01-17 MED ORDER — AZITHROMYCIN 250 MG PO TABS
250 MG | PACK | ORAL | 0 refills | Status: AC
Start: 2015-01-17 — End: 2015-01-27

## 2015-01-17 NOTE — Telephone Encounter (Signed)
Rx e-scribed.  Patient is aware

## 2015-01-17 NOTE — Telephone Encounter (Signed)
OK to send in a Rx for Zpak. Take 2 today then 1 daily until all gone; take with food.

## 2015-01-17 NOTE — Telephone Encounter (Signed)
PT CALLING WANTING A Z-PAK FOR A "COLD" HE SAYS STARTED YESTERDAY.  HE SAYS HE HAS A LOW-GRADE FEVER, MOSTLY HEAD CONGESTION, SOME COUGH, NO PRODUCTION YET.  USING [facility] [phone].  PLEASE LET HIM KNOW WHAT DR DECIDES

## 2015-04-25 MED ORDER — SIMVASTATIN 20 MG PO TABS
20 MG | ORAL_TABLET | ORAL | 1 refills | Status: AC
Start: 2015-04-25 — End: ?

## 2015-04-25 NOTE — Telephone Encounter (Signed)
LAST SEEN       NEXT APPOINTMENT       LAST FILL    10.3.16  None   1.9.17

## 2017-06-25 ENCOUNTER — Emergency Department: Admit: 2017-06-26 | Payer: MEDICARE | Primary: Internal Medicine

## 2017-06-25 DIAGNOSIS — R0789 Other chest pain: Secondary | ICD-10-CM

## 2017-06-25 NOTE — ED Provider Notes (Signed)
CHIEF COMPLAINT  Chest Pain and Shortness of Breath      HISTORY OF PRESENT ILLNESS  Brandon Powell is a 72 y.o. male with a history of hypercholesterolemia is who presents to the ED complaining of chest pain, and shortness of breath.  Patient reports he was mowing the lawn this evening when he had sudden onset severe substernal chest pain with radiation towards the neck.  Patient also reported some shortness of breath.  Patient stopped mowing and went inside to rest at which point the symptoms resolved.  He states that shortly thereafter he attempted to ambulate his stairs when he had a reoccurrence of symptoms.  Patient denies chest pain at rest..   No other complaints, modifying factors or associated symptoms.     I have reviewed the following from the nursing documentation.    Past Medical History:   Diagnosis Date   ??? Abnormal PSA     increased   ??? Cholelithiasis     with acute cholecystitis   ??? Disorders of bursae and tendons in shoulder region, unspecified     tendinitis, right shoulder   ??? Dysphagia     unspecified   ??? Dysphagia 11/15/2008    2008 esophagram, HH, reflux, Schatzki's ring - no treatment indicated since only mildly symptomatic 12/11 Dr. Roselle Locus dilated a Schatzki's ring. 1/12 Seen by Dr. Augusto Gamble for persistent dysphagia; he plans to repeat EGD with dilitation    ??? Erectile dysfunction    ??? GERD (gastroesophageal reflux disease)    ??? History of cholecystectomy 3/10    lap- Dr. Elige Radon   ??? Hypercholesterolemia    ??? OSA (obstructive sleep apnea) 07/28/2010    7/12 Pt saw Dr. Vanice Sarah for OSA, is considering his options.  9/14 He has decided to not pursue any further treatment. He does not fall asleep during the day, feels rested when he awakens, has not been observed to stop breathing for long periods when he sleeps.    ??? Peripheral Neuropathy     feet   ??? PVC's    ??? Schatzki's ring 11/2006     Past Surgical History:   Procedure Laterality Date   ??? COLONOSCOPY  11/08    Dr. Roselle Locus, divertics only,  repeat 2018    ??? UPPER GASTROINTESTINAL ENDOSCOPY  12/11    Dr. Roselle Locus dilated a Schatzki's ring    ??? UPPER GASTROINTESTINAL ENDOSCOPY  1/12    Dr. Augusto Gamble dilated Schatzki's ring with a balloon     Family History   Problem Relation Age of Onset   ??? Hypertension Father    ??? Heart Disease Father 56        Died of MI, probably also CHF   ??? Diabetes Sister         DM     Social History     Socioeconomic History   ??? Marital status: Married     Spouse name: Not on file   ??? Number of children: Not on file   ??? Years of education: Not on file   ??? Highest education level: Not on file   Occupational History   ??? Not on file   Social Needs   ??? Financial resource strain: Not on file   ??? Food insecurity:     Worry: Not on file     Inability: Not on file   ??? Transportation needs:     Medical: Not on file     Non-medical: Not on file   Tobacco  Use   ??? Smoking status: Never Smoker   ??? Smokeless tobacco: Never Used   Substance and Sexual Activity   ??? Alcohol use: Yes     Alcohol/week: 0.0 oz     Types: 1 - 2 Glasses of wine per week     Comment: 1-2 drinks per week   ??? Drug use: No   ??? Sexual activity: Yes     Partners: Female   Lifestyle   ??? Physical activity:     Days per week: Not on file     Minutes per session: Not on file   ??? Stress: Not on file   Relationships   ??? Social connections:     Talks on phone: Not on file     Gets together: Not on file     Attends religious service: Not on file     Active member of club or organization: Not on file     Attends meetings of clubs or organizations: Not on file     Relationship status: Not on file   ??? Intimate partner violence:     Fear of current or ex partner: Not on file     Emotionally abused: Not on file     Physically abused: Not on file     Forced sexual activity: Not on file   Other Topics Concern   ??? Not on file   Social History Narrative   ??? Not on file     No current facility-administered medications for this encounter.      Current Outpatient Medications   Medication Sig  Dispense Refill   ??? simvastatin (ZOCOR) 20 MG tablet TAKE 1 TABLET EVERY EVENING 90 tablet 1   ??? sildenafil (REVATIO) 20 MG tablet Take 2-3 tablets as needed for ED 60 tablet 5   ??? aspirin 81 MG tablet Take 81 mg by mouth daily.     ??? IBUPROFEN by Does not apply route.       ??? mometasone (NASONEX) 50 MCG/ACT nasal spray 2 sprays by Nasal route daily.       Allergies   Allergen Reactions   ??? Sulfa Antibiotics      Critical reaction- Hallucinations       REVIEW OF SYSTEMS  10 systems reviewed, pertinent positives per HPI otherwise noted to be negative.    PHYSICAL EXAM  BP (!) 148/84    Pulse 74    Temp 98.2 ??F (36.8 ??C) (Oral)    Resp 16    Ht 5\' 10"  (1.778 m)    Wt 92.2 kg (203 lb 4 oz)    SpO2 98%    BMI 29.16 kg/m??   GENERAL APPEARANCE: Awake and alert. Cooperative. No acute distress.  HEAD: Normocephalic. Atraumatic.  EYES: PERRL. EOM's grossly intact.   ENT: Mucous membranes are moist.   NECK: Supple, trachea midline.   HEART: RRR. Normal S1S2, no rubs, gallops, or murmurs noted  LUNGS: Respirations unlabored. CTAB. Good air exchange. No wheezes, rales, or rhonchi.  Speaking comfortably in full sentences.   ABDOMEN: Soft. Non-distended. Non-tender. No guarding or rebound. Normal bowel sounds.  EXTREMITIES: No peripheral edema. MAEE. No acute deformities.  SKIN: Warm and dry. No acute rashes.   NEUROLOGICAL: Alert and oriented X 3. CN II-XII intact. No gross facial drooping. Strength 5/5, sensation intact. Normal coordination. No pronator drift.  Gait normal.   PSYCHIATRIC: Normal mood and affect.    LABS  I have reviewed all labs for this visit.  Results for orders placed or performed during the hospital encounter of 06/25/17   CBC Auto Differential   Result Value Ref Range    WBC 7.8 4.0 - 11.0 K/uL    RBC 4.33 4.20 - 5.90 M/uL    Hemoglobin 14.5 13.5 - 17.5 g/dL    Hematocrit 16.1 09.6 - 52.5 %    MCV 98.2 80.0 - 100.0 fL    MCH 33.6 26.0 - 34.0 pg    MCHC 34.2 31.0 - 36.0 g/dL    RDW 04.5 40.9 - 81.1 %     Platelets 215 135 - 450 K/uL    MPV 8.6 5.0 - 10.5 fL    Neutrophils % 59.6 %    Lymphocytes % 26.1 %    Monocytes % 9.2 %    Eosinophils % 4.5 %    Basophils % 0.6 %    Neutrophils # 4.6 1.7 - 7.7 K/uL    Lymphocytes # 2.0 1.0 - 5.1 K/uL    Monocytes # 0.7 0.0 - 1.3 K/uL    Eosinophils # 0.3 0.0 - 0.6 K/uL    Basophils # 0.0 0.0 - 0.2 K/uL   Comprehensive Metabolic Panel   Result Value Ref Range    Sodium 140 136 - 145 mmol/L    Potassium 4.0 3.5 - 5.1 mmol/L    Chloride 102 99 - 110 mmol/L    CO2 28 21 - 32 mmol/L    Anion Gap 10 3 - 16    Glucose 111 (H) 70 - 99 mg/dL    BUN 10 7 - 20 mg/dL    CREATININE 0.8 0.8 - 1.3 mg/dL    GFR Non-African American >60 >60    GFR African American >60 >60    Calcium 9.2 8.3 - 10.6 mg/dL    Total Protein 7.2 6.4 - 8.2 g/dL    Alb 4.4 3.4 - 5.0 g/dL    Albumin/Globulin Ratio 1.6 1.1 - 2.2    Total Bilirubin 0.6 0.0 - 1.0 mg/dL    Alkaline Phosphatase 75 40 - 129 U/L    ALT 23 10 - 40 U/L    AST 20 15 - 37 U/L    Globulin 2.8 g/dL   Troponin   Result Value Ref Range    Troponin <0.01 <0.01 ng/mL       EKG  The Ekg interpreted by myself  normal sinus rhythm with a rate of 71  Axis is   Normal  QTc is  normal  Intervals and Durations are unremarkable.      No specific ST-T wave changes appreciated.  No evidence of acute ischemia.   No previous EKGs available for review.  Cardiac Monitoring: No ectopy.        RADIOLOGY  X-RAYS:  I have reviewed radiologic plain film image(s).  ALL OTHER NON-PLAIN FILM IMAGES SUCH AS CT, ULTRASOUND AND MRI HAVE BEEN READ BY THE RADIOLOGIST.  XR CHEST STANDARD (2 VW)   Final Result     No acute cardiopulmonary abnormality.                     Rechecks: Physical assessment performed.        Asymptomatic at rest.            HEART SCORE:    History: +2 for high suspicion  EKG: +0 for normal EKG   Age: +69 for age >65 years  Risk factors (includes HLD, HTN, DM, tobacco use, obesity, and +FHx): +  1 for 1-2 risk factors  Initial troponin: +0 for negative  troponin    Heart score: 5.  This falls under the following category: Score of 4-6, which indicates low/moderate risk for major adverse cardiac event and supports observation with repeated troponins and/or non-invasive testing      ED COURSE/MDM  Patient seen and evaluated. Old records reviewed. Labs and imaging reviewed and results discussed with patient. Patient was given aspirin upon arrival.  Patient remained asymptomatic while at rest in the emergency department.  Patient was reassessed as noted above .   Patient will be admitted to hospitalist staff for further evaluation and treatment. . Plan of care discussed with patient and family. Patient and family in agreement with plan.     Patient was given scripts for the following medications. I counseled patient how to take these medications.   New Prescriptions    No medications on file       CLINICAL IMPRESSION  1. Chest pain, unspecified type    2. Exertional angina (HCC)        Blood pressure (!) 148/84, pulse 74, temperature 98.2 ??F (36.8 ??C), temperature source Oral, resp. rate 16, height 5\' 10"  (1.778 m), weight 92.2 kg (203 lb 4 oz), SpO2 98 %.    DISPOSITION  Daneen Schick was discharged to admitted in stable condition.         Phineas Semen, DO  06/26/17 0045

## 2017-06-25 NOTE — ED Notes (Signed)
Bed: 06  Expected date:   Expected time:   Means of arrival:   Comments:  Open @ 2045     Norval MortonKathryn M Eusebio Blazejewski, RN  06/25/17 2242

## 2017-06-25 NOTE — ED Notes (Signed)
EMD to examine     Lesia SagoMary Jo A Nissan Frazzini, RN  06/25/17 [phone]

## 2017-06-25 NOTE — ED Triage Notes (Signed)
Mowing grass when he got SOB and chest tightness

## 2017-06-26 ENCOUNTER — Inpatient Hospital Stay
Admit: 2017-06-26 | Discharge: 2017-06-26 | Disposition: A | Discharge status: Home / Self Care | Payer: Medicare Other | Source: Ambulatory Visit | Admitting: Internal Medicine

## 2017-06-26 LAB — COMPREHENSIVE METABOLIC PANEL
ALT: 23 U/L (ref 10–40)
AST: 20 U/L (ref 15–37)
Albumin/Globulin Ratio: 1.6 (ref 1.1–2.2)
Albumin: 4.4 g/dL (ref 3.4–5.0)
Alkaline Phosphatase: 75 U/L (ref 40–129)
Anion Gap: 10 (ref 3–16)
BUN: 10 mg/dL (ref 7–20)
CO2: 28 mmol/L (ref 21–32)
Calcium: 9.2 mg/dL (ref 8.3–10.6)
Chloride: 102 mmol/L (ref 99–110)
Creatinine: 0.8 mg/dL (ref 0.8–1.3)
GFR African American: >60 (ref >60)
GFR Non-African American: >60 (ref >60)
Globulin: 2.8 g/dL
Glucose: 111 mg/dL — ABNORMAL HIGH (ref 70–99)
Potassium: 4 mmol/L (ref 3.5–5.1)
Sodium: 140 mmol/L (ref 136–145)
Total Bilirubin: 0.6 mg/dL (ref 0.0–1.0)
Total Protein: 7.2 g/dL (ref 6.4–8.2)

## 2017-06-26 LAB — CBC WITH AUTO DIFFERENTIAL
Basophils %: 0.6 %
Basophils Absolute: 0 10*3/uL (ref 0.0–0.2)
Eosinophils %: 4.5 %
Eosinophils Absolute: 0.3 10*3/uL (ref 0.0–0.6)
Hematocrit: 42.5 % (ref 40.5–52.5)
Hemoglobin: 14.5 g/dL (ref 13.5–17.5)
Lymphocytes %: 26.1 %
Lymphocytes Absolute: 2 10*3/uL (ref 1.0–5.1)
MCH: 33.6 pg (ref 26.0–34.0)
MCHC: 34.2 g/dL (ref 31.0–36.0)
MCV: 98.2 fL (ref 80.0–100.0)
MPV: 8.6 fL (ref 5.0–10.5)
Monocytes %: 9.2 %
Monocytes Absolute: 0.7 10*3/uL (ref 0.0–1.3)
Neutrophils %: 59.6 %
Neutrophils Absolute: 4.6 10*3/uL (ref 1.7–7.7)
Platelets: 215 10*3/uL (ref 135–450)
RBC: 4.33 M/uL (ref 4.20–5.90)
RDW: 12.6 % (ref 12.4–15.4)
WBC: 7.8 10*3/uL (ref 4.0–11.0)

## 2017-06-26 LAB — NM MYOCARDIAL SPECT REST EXERCISE OR RX: Left Ventricular Ejection Fraction: 70

## 2017-06-26 LAB — LIPID PANEL
Cholesterol, Total: 144 mg/dL (ref 0–199)
HDL: 41 mg/dL (ref 40–60)
LDL Calculated: 83 mg/dL (ref <100)
Triglycerides: 98 mg/dL (ref 0–150)
VLDL Cholesterol Calculated: 20 mg/dL

## 2017-06-26 LAB — TROPONIN
Troponin: <0.01 ng/mL (ref <0.01)
Troponin: <0.01 ng/mL (ref <0.01)
Troponin: <0.01 ng/mL (ref <0.01)

## 2017-06-26 MED ORDER — NORMAL SALINE FLUSH 0.9 % IV SOLN
0.9 % | INTRAVENOUS | Status: DC | PRN
Start: 2017-06-26 — End: 2017-06-26
  Administered 2017-06-26 (×2): 10 mL via INTRAVENOUS

## 2017-06-26 MED ORDER — LACTATED RINGERS IV SOLN
INTRAVENOUS | Status: DC
Start: 2017-06-26 — End: 2017-06-26
  Administered 2017-06-26: 10:00:00 via INTRAVENOUS

## 2017-06-26 MED ORDER — NORMAL SALINE FLUSH 0.9 % IV SOLN
0.9 % | Freq: Two times a day (BID) | INTRAVENOUS | Status: DC
Start: 2017-06-26 — End: 2017-06-26
  Administered 2017-06-26: 16:00:00 10 mL via INTRAVENOUS

## 2017-06-26 MED ORDER — NITROGLYCERIN 0.4 MG SL SUBL
0.4 MG | SUBLINGUAL | Status: DC | PRN
Start: 2017-06-26 — End: 2017-06-26

## 2017-06-26 MED ORDER — CARVEDILOL 3.125 MG PO TABS
3.125 MG | Freq: Two times a day (BID) | ORAL | Status: DC
Start: 2017-06-26 — End: 2017-06-26

## 2017-06-26 MED ORDER — MAGNESIUM HYDROXIDE 400 MG/5ML PO SUSP
400 MG/5ML | Freq: Every day | ORAL | Status: DC | PRN
Start: 2017-06-26 — End: 2017-06-26

## 2017-06-26 MED ORDER — ASPIRIN 81 MG PO CHEW
81 MG | Freq: Every day | ORAL | Status: DC
Start: 2017-06-26 — End: 2017-06-26

## 2017-06-26 MED ORDER — ASPIRIN 81 MG PO CHEW
81 MG | Freq: Once | ORAL | Status: DC
Start: 2017-06-26 — End: 2017-06-25

## 2017-06-26 MED ORDER — TECHNETIUM TC 99M TETROFOSMIN IV KIT
Freq: Once | INTRAVENOUS | Status: AC | PRN
Start: 2017-06-26 — End: 2017-06-26
  Administered 2017-06-26: 12:00:00 10 via INTRAVENOUS

## 2017-06-26 MED ORDER — TECHNETIUM TC 99M TETROFOSMIN IV KIT
Freq: Once | INTRAVENOUS | Status: AC | PRN
Start: 2017-06-26 — End: 2017-06-26
  Administered 2017-06-26: 14:00:00 30 via INTRAVENOUS

## 2017-06-26 MED ORDER — SIMVASTATIN 20 MG PO TABS
20 MG | Freq: Every evening | ORAL | Status: DC
Start: 2017-06-26 — End: 2017-06-26

## 2017-06-26 MED ORDER — ACETAMINOPHEN 325 MG PO TABS
325 MG | ORAL | Status: DC | PRN
Start: 2017-06-26 — End: 2017-06-26

## 2017-06-26 MED ORDER — ASPIRIN 81 MG PO CHEW
81 MG | Freq: Once | ORAL | Status: AC
Start: 2017-06-26 — End: 2017-06-25
  Administered 2017-06-26: 04:00:00 324 mg via ORAL

## 2017-06-26 MED ORDER — ONDANSETRON HCL 4 MG/2ML IJ SOLN
4 MG/2ML | Freq: Four times a day (QID) | INTRAMUSCULAR | Status: DC | PRN
Start: 2017-06-26 — End: 2017-06-26

## 2017-06-26 MED ORDER — ENOXAPARIN SODIUM 40 MG/0.4ML SC SOLN
40 MG/0.4ML | Freq: Every day | SUBCUTANEOUS | Status: DC
Start: 2017-06-26 — End: 2017-06-26
  Administered 2017-06-26: 16:00:00 40 mg via SUBCUTANEOUS

## 2017-06-26 MED FILL — ASPIRIN LOW DOSE 81 MG PO CHEW: 81 mg | ORAL | Qty: 4

## 2017-06-26 MED FILL — LOVENOX 40 MG/0.4ML SC SOLN: 40 MG/0.4ML | SUBCUTANEOUS | Qty: 0.4

## 2017-06-26 NOTE — Progress Notes (Signed)
Patient arrived from [facility] via stretcher to [phone]. Patient alert and oriented. VSS Denies pain . Tele monitoring applied. Safety precautions in place. Wife at bedside. Will continue to monitor and treat per MD orders.

## 2017-06-26 NOTE — Progress Notes (Signed)
Pt returned from stress test.

## 2017-06-26 NOTE — ED Notes (Signed)
[facility] transport here.  Patient assessment unchanged. [facility] EMS cot[facility]     Ula Couvillon Jo A Rayona Sardinha, RN  06/26/17 [phone]

## 2017-06-26 NOTE — Plan of Care (Signed)
Problem: Pain:  Description  Pain management should include both nonpharmacologic and pharmacologic interventions.  Goal: Pain level will decrease  Outcome: Ongoing  Note:   Patient denied pain and or SOB during shift. VSS. Patient in bed with no complaints at this time. Will continue to monitor.

## 2017-06-26 NOTE — Progress Notes (Signed)
Pt d/c to home with spouse via personal vehicle. AVS reviewed and signed by pt. All personal belongings taken from room by pt and spouse.

## 2017-06-26 NOTE — Progress Notes (Signed)
Advanced Care Planning Note.    Purpose of Encounter: Advanced care planning in light of cp, htn , hld .  Parties in attendance:   [name], Dr. Lurline Delvenu madhav Starlynn Powell   (myself).  Decisional Capacity: ys   ??  Subjective/Patient Story: Patient/Family understands that her medical condition continues to deteriorate.    She/he no longer wishes further curative interventions, including hospitalization, if there is no long term benefit :no  Patient/Family wishes to continue further interventions, patient will re-evaluate at a later time: yes   ??  Subjective and Objective Medical history :   Patient seen and examined  No pain  Feels good  Clear lungs     ??  Goals of Care Determinations: Patient/Family wishes to focus sx ocntrol .   ??  Plan:  As above  ??  Code Status: At this time patient/Family  wishes to be fullcode .  ??  Time Spent on Advanced Planning Documents: 32 mins.  The following items were considered in medical decision making:  Independent review of images , Review / order clinical lab tests. Review / order radiology tests, Decision to obtain old records.  Advanced Care Planning Documents: Completed advances directives, advanced directives in chart.      Brandon Powell M. Brandon Paschal, MD  06/26/2017 8:04 AM

## 2017-06-26 NOTE — Progress Notes (Signed)
Pt to stress test.

## 2017-06-26 NOTE — ED Notes (Signed)
Pt resting quietly in bed, NSR, denies CP or SOB.  Wife with patient.     Lesia SagoMary Jo A Janda Cargo, RN  06/26/17 [phone]

## 2017-06-26 NOTE — Progress Notes (Shared)
4 Eyes Admission Assessment     I agree as the admission nurse that 2 RN's have performed a thorough Head to Toe Skin Assessment on the patient. ALL assessment sites listed below have been assessed on admission.       Areas assessed by both nurses: ***  [x]    Head, Face, and Ears   [x]    Shoulders, Back, and Chest  [x]    Arms, Elbows, and Hands   [x]    Coccyx, Sacrum, and Ischum  [x]    Legs, Feet, and Heels        Does the Patient have Skin Breakdown?  No         Braden Prevention initiated:  No   Wound Care Orders initiated:  No      WOC nurse consulted for Pressure Injury (Stage 3,4, Unstageable, DTI, NWPT, and Complex wounds):  No      Nurse 1 eSignature: Electronically signed by Salomon Fick, RN on 06/26/17 at 4:46 AM    **SHARE this note so that the co-signing nurse is able to place an eSignature**    Nurse 2 eSignature: {Esignature:[id number]}

## 2017-06-26 NOTE — ED Notes (Signed)
Patient denies CP or SOB. Ambulated to the restroom without difficulty.  Denied Cp or SOB after walking.  Waiting ETA on Ambulance     Lesia SagoMary Jo A Beau Vanduzer, RN  06/26/17 [phone]

## 2017-06-26 NOTE — H&P (Signed)
Hospital Medicine History & Physical      PCP: No primary care provider on file.    Date of Admission: 06/25/2017    Date of Service: Pt seen/examined on 6.8.19 .  Patient will be admitted  in/to the hospital.    Chief Complaint:  Cp while lawn moving       History Of Present Illness:      72 y.o. male who [facility]  with above complaints.  Symptom onset has been acute for a time period of 1 day(s). Severity is described as mild-moderate. Course of his symptoms over time is resolved.  Associated with exerting while doing lawm moving   Not Associated with fever   No h/o of recent travel, ill contacts,weight loss.  Pain description - Location mid chest , rated 5/10, which is pressure  in nature, radiation to none , started after lawn moving   , aggravated by none , relieved by none .      Past Medical History:          Diagnosis Date   ??? Abnormal PSA     increased   ??? Cholelithiasis     with acute cholecystitis   ??? Disorders of bursae and tendons in shoulder region, unspecified     tendinitis, right shoulder   ??? Dysphagia     unspecified   ??? Dysphagia 11/15/2008    2008 esophagram, HH, reflux, Schatzki's ring - no treatment indicated since only mildly symptomatic 12/11 Dr. Roselle LocusSaeed dilated a Schatzki's ring. 1/12 Seen by Dr. Augusto GambleBekal for persistent dysphagia; he plans to repeat EGD with dilitation    ??? Erectile dysfunction    ??? GERD (gastroesophageal reflux disease)    ??? History of cholecystectomy 3/10    lap- Dr. Elige RadonBradley   ??? Hypercholesterolemia    ??? OSA (obstructive sleep apnea) 07/28/2010    7/12 Pt saw Dr. Vanice SarahManders for OSA, is considering his options.  9/14 He has decided to not pursue any further treatment. He does not fall asleep during the day, feels rested when he awakens, has not been observed to stop breathing for long periods when he sleeps.    ??? Peripheral Neuropathy     feet   ??? PVC's    ??? Schatzki's ring 11/2006       Past Surgical History:          Procedure Laterality Date   ???  COLONOSCOPY  11/08    Dr. Roselle Locussaeed, divertics only, repeat 2018    ??? UPPER GASTROINTESTINAL ENDOSCOPY  12/11    Dr. Roselle Locussaeed dilated a Schatzki's ring    ??? UPPER GASTROINTESTINAL ENDOSCOPY  1/12    Dr. Augusto GambleBekal dilated Schatzki's ring with a balloon       Medications Prior to Admission:      Prior to Admission medications    Medication Sig Start Date End Date Taking? Authorizing Provider   simvastatin (ZOCOR) 20 MG tablet TAKE 1 TABLET EVERY EVENING 04/25/15  Yes Dewayne HatchStephen Payne, MD   sildenafil (REVATIO) 20 MG tablet Take 2-3 tablets as needed for ED 08/02/14  Yes Dewayne HatchStephen Payne, MD   aspirin 81 MG tablet Take 81 mg by mouth daily.   Yes Historical Provider, MD   IBUPROFEN by Does not apply route.     Yes Historical Provider, MD   mometasone (NASONEX) 50 MCG/ACT nasal spray 2 sprays by Nasal route daily. 06/29/12 07/28/12  Historical Provider, MD       Allergies:  Sulfa antibiotics    Social History:      The patient currently lives at home       TOBACCO:   reports that he has never smoked. He has never used smokeless tobacco.  ETOH:   reports that he drinks alcohol.      Family History:             Problem Relation Age of Onset   ??? Hypertension Father    ??? Heart Disease Father 60        Died of MI, probably also CHF   ??? Diabetes Sister         DM         PHYSICAL EXAM PERFORMED BY ME:    BP 131/76    Pulse 62    Temp 97.6 ??F (36.4 ??C) (Oral)    Resp 16    Ht 5\' 10"  (1.778 m)    Wt 203 lb 4 oz (92.2 kg)    SpO2 98%    BMI 29.16 kg/m??     General appearance: comfortable, distress- none   HEENT: Atraumatic, Pupils equal, round, and reactive to light.  Extra ocular muscles intact.    Neck: Supple, with full range of motion. No jugular venous distention.   Respiratory:  Clear  auscultation , accessory muscle use no, Rales/Ronchi no  Cardiovascular: Regular rate and rhythm with normal S1/S2 ,  Abdomen: Soft, non-tender, non-distended with normal bowel sounds.  Musculoskelatal: No clubbing, no edema bilaterally.     Dorsalis pedal pulses  +   And capillary refills time is  <  than 3 secs.  Skin: Skin color, texture, turgor normal.     Neurologic:   Neurovascularly intact grossly non-focal.      CXR:  I have reviewed the CXR with the following interpretation: clear   EKG:  I have reviewed the EKG with the following interpretation: nsr     Labs:     Recent Labs     06/25/17  2334   WBC 7.8   HGB 14.5   HCT 42.5   PLT 215     Recent Labs     06/25/17  2334   NA 140   K 4.0   CL 102   CO2 28   BUN 10   CREATININE 0.8   CALCIUM 9.2     Recent Labs     06/25/17  2334   AST 20   ALT 23   BILITOT 0.6   ALKPHOS 75     No results for input(s): INR in the last 72 hours.  Recent Labs     06/25/17  2334 06/26/17  0429   TROPONINI <0.01 <0.01     No flowsheet data found.      Urinalysis:    No results found for: NITRU, WBCUA, BACTERIA, RBCUA, BLOODU, SPECGRAV, GLUCOSEU         Diagnostic Assesment and Plan :   1. Exertional cp, ro acs  For stress test today  2. hld on statin  3. htn on asa, coreg   Body mass index is 29.16 kg/m??.             The following items were considered in medical decision making:  Independent review of images, Review / order clinical lab tests, Review / order radiology, tests Decision to obtain old records.  DVT Prophylaxis: lvx   Diet: Diet NPO Effective Now Exceptions are: Sips with Meds  Diet NPO, After Midnight Exceptions  are: Sips with Meds  Code Status: Full Code    PT/OT Eval Status: na   Social services : na     Dispo - will monitor in floor   Needs to stay in hospital for stress test .   I have discussed the above stated plan with the patient,  and wife  and they verbalized understanding and agreed with the plan.  A total time of 15 min were exclusively devoted to discuss plan of care, and advanced care planning during this encounter.   RN notified about todays plan  bed side.   Sumedha Munnerlyn M. Sylvanus Telford, MD  [phone]  This chart was generated in part by using Dragon Dictation system and may contain errors related to that system including  errors in grammar, punctuation, and spelling, as well as words and phrases that may be inappropriate. When dictating, effort is made to correct spelling/grammar errors. If there are any questions or concerns please feel free to contact the dictating provider for clarification.   Thank you No primary care provider on file. for the opportunity to be involved in this patient's care. If you have any questions or concerns please feel free to contact me at [phone].

## 2017-06-26 NOTE — ED Notes (Signed)
Patient denies CP or SOB, NSR on monitor.  Waiting for rest of lab results.      Brandon SagoMary Jo [name], RN  06/26/17 [phone]

## 2017-06-26 NOTE — Discharge Summary (Signed)
Hospital Medicine Discharge Summary    Patient ID: Brandon Powell      Patient's PCP: No primary care provider on file.    Admit Date: 06/25/2017     Discharge Date: 06/26/2017      Admitting Physician: Ronnald Ramponna R Adams, MD     Discharge Physician: Aleda GranaVenu M. Ulysess Witz, MD     Discharge Diagnoses:       Active Hospital Problems    Diagnosis Date Noted   ??? Chest pain [R07.9] 06/26/2017       The patient was seen and examined on day of discharge and this discharge summary is in conjunction with any daily progress note from day of discharge.     Chief Complaint: chest pain     Subjective: no     ROS: as noted above and  All other systems reviewed and negative.    Exam performed by me:    BP 126/77    Pulse 63    Temp 97 ??F (36.1 ??C) (Oral)    Resp 16    Ht 5\' 10"  (1.778 m)    Wt 203 lb 4 oz (92.2 kg)    SpO2 93%    BMI 29.16 kg/m??     General appearance: No apparent distress, appears stated age   HEENT: Pupils equal, round, and reactive to light.    Neck: Supple, with full range of motion. No jugular venous distention   Respiratory:  Normal respiratory effort. Clear to auscultation   Cardiovascular: Regular rate and rhythm with normal S1/S2    Abdomen: Soft, non-tender, non-distended with normal bowel sounds.  Musculoskelatal: No clubbing, cyanosis or edema bilaterally.     Skin: Skin color, texture, turgor normal.  No rashes or lesions.  Neurologic:  Neurovascularly intact   grossly non-focal.  Psychiatric: Alert and oriented x3.    Labs:   Recent Labs     06/25/17  2334   WBC 7.8   HGB 14.5   HCT 42.5   PLT 215     Recent Labs     06/25/17  2334   NA 140   K 4.0   CL 102   CO2 28   BUN 10   CREATININE 0.8   CALCIUM 9.2     Recent Labs     06/25/17  2334   AST 20   ALT 23   BILITOT 0.6   ALKPHOS 75     No results for input(s): INR in the last 72 hours.  Recent Labs     06/25/17  2334 06/26/17  0429 06/26/17  0735   TROPONINI <0.01 <0.01 <0.01         Hospital course    Active Hospital Problems    Diagnosis Date Noted    ??? Chest pain [R07.9] 06/26/2017       Diagnostic Assesment and Plan :   1. Exertional cp, no more pain now, neg trops and neg exercise stress test      2. hld on statin  3. htn on asa, coreg   Body mass index is 29.16 kg/m??.  ??           The following items were considered in medical decision making:  Independent review of images, Review / order clinical lab tests, Review / order radiology, tests Decision to obtain old records.   Discharge Condition -  Hemodynamically Stable.     Consults:     None    Disposition:  Home  Patient was advised to seek immediate medical attention if his /her sx worsen, by calling pcp or  911  Discharge Instructions/Follow-up:     Dr. No primary care provider on file. in the next 1-2 weeks.    Specialist no   Referral given no    For medical questions after discharge, contact the [facility]  Answering Service at [phone] to have the physician on call paged.  For medication questions, contact the pharmacist on call at [phone].  Your discharging physician is Dr. Aleda Grana. Armonee Bojanowski and your admitting physician was Dr. Ronnald Ramp, MD.      Code Status:  Full Code     Activity: as tol      Diet: regular diet    Discharge Medications:     Discharge Medication List as of 06/26/2017 12:37 PM           Details   simvastatin (ZOCOR) 20 MG tablet TAKE 1 TABLET EVERY EVENING, Disp-90 tablet, R-1Normal      aspirin 81 MG tablet Take 81 mg by mouth daily.             Time Spent on discharge is more than 30 minutes in the examination, evaluation, counseling and review of medications and discharge plan with the patient.    Family was notified regarding discharge.  The patient Brandon Powell was advised to consult their PCP/ or call 911 to proceed to nearest ED in the event if symptoms are not getting better and are getting worse .  This chart was generated in part by using Dragon Dictation system and may contain errors related to that system including errors in grammar, punctuation, and spelling, as  well as words and phrases that may be inappropriate. When dictating, effort is made to correct spelling/grammar errors. If there are any questions or concerns please feel free to contact the dictating provider for clarification.      Signed:    Cathan Gearin M. Adis Sturgill, MD   06/26/2017      Thank you No primary care provider on file. for the opportunity to be involved in this patient's care. If you have any questions or concerns please feel free to contact me at [phone].

## 2017-06-28 LAB — EKG 12-LEAD
Atrial Rate: 63 {beats}/min
Atrial Rate: 71 {beats}/min
P Axis: 36 degrees
P Axis: 53 degrees
P-R Interval: 152 ms
P-R Interval: 156 ms
Q-T Interval: 408 ms
Q-T Interval: 426 ms
QRS Duration: 102 ms
QRS Duration: 96 ms
QTc Calculation (Bazett): 435 ms
QTc Calculation (Bazett): 443 ms
R Axis: 32 degrees
R Axis: 68 degrees
T Axis: 30 degrees
T Axis: 64 degrees
Ventricular Rate: 63 {beats}/min
Ventricular Rate: 71 {beats}/min

## 2020-04-18 ENCOUNTER — Encounter

## 2020-04-19 ENCOUNTER — Inpatient Hospital Stay: Admit: 2020-04-19 | Payer: MEDICARE | Primary: Internal Medicine

## 2020-04-19 DIAGNOSIS — S43422A Sprain of left rotator cuff capsule, initial encounter: Secondary | ICD-10-CM

## 2021-08-15 IMAGING — MR MRI LEFT FOOT WITHOUT CONTRAST
6 of 8 series · 26 of 40 positions shown · IV contrast (gadolinium)
Comparison: None.

________________________________________________________________________________________________ 
MRI LEFT FOOT WITHOUT CONTRAST, 08/15/2021 [DATE]: 
CLINICAL INDICATION: Left forefoot pain.
TECHNIQUE: Multiplanar, multiecho position MR images of the forefoot were 
performed without intravenous gadolinium enhancement. Patient was scanned on a 
1.5T magnet.

[Series 101: survey_fullfov_transversal · axial · 10.0mm · 1.84mm/px · z∈[-51,+51]mm · 2 of 7 slices shown]
[im 1/7]
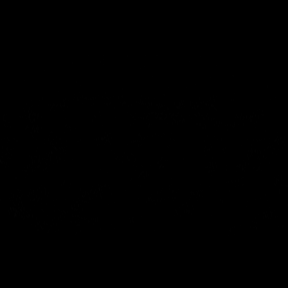
[im 7/7]
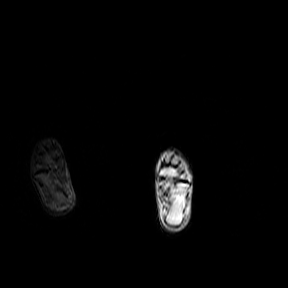

[Series 201: survey_mst · axial · 10.0mm · 1.17mm/px · z∈[-20,+149]mm · 2 of 11 slices shown]
[im 1/11]
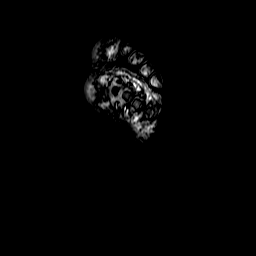
[im 11/11]
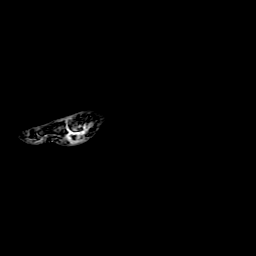

[Series 301: t1_sax_foot · coronal · 3.0mm · 0.25mm/px · 7 of 40 slices shown]
[im 1/40]
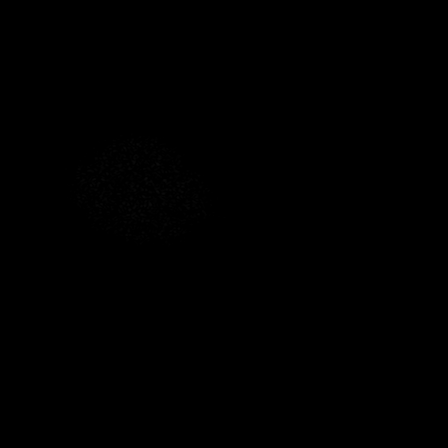
[im 7/40]
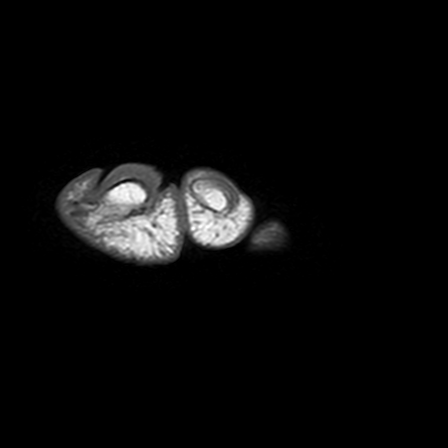
[im 14/40]
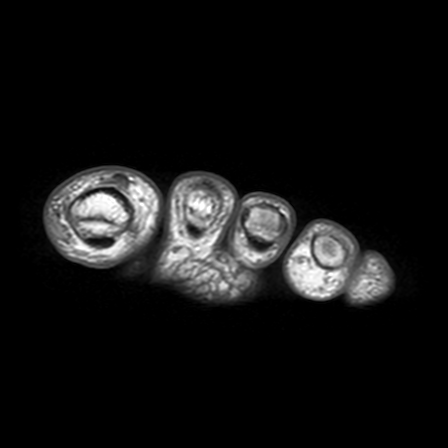
[im 20/40]
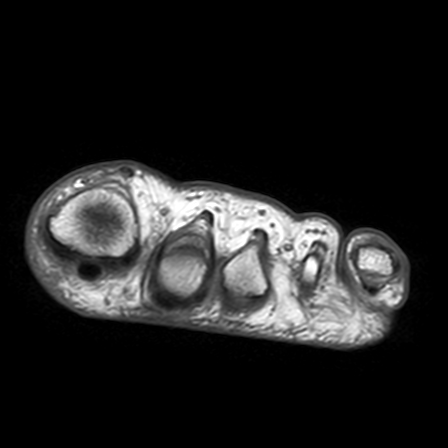
[im 27/40]
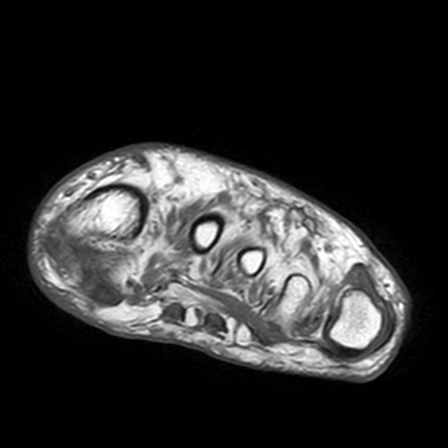
[im 33/40]
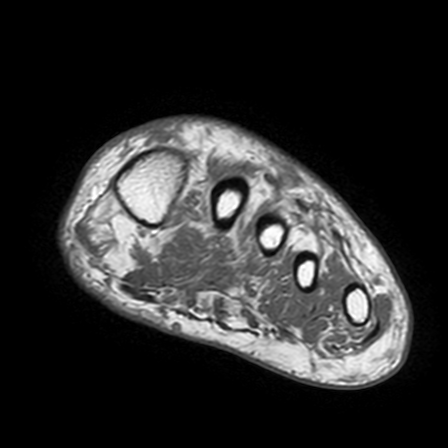
[im 40/40]
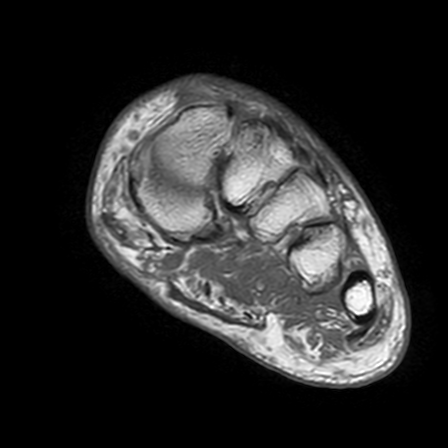

[Series 401: pd_fs_sax_foot · coronal · 3.0mm · 0.34mm/px · 7 of 40 slices shown]
[im 1/40]
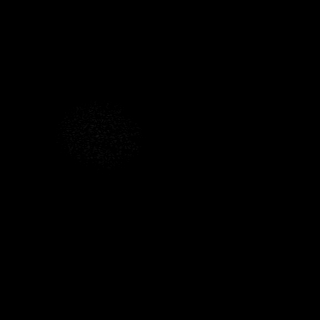
[im 7/40]
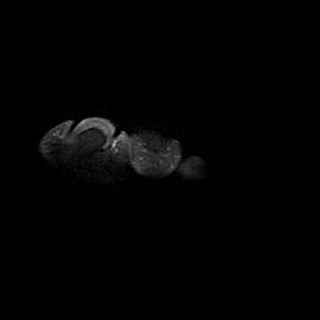
[im 14/40]
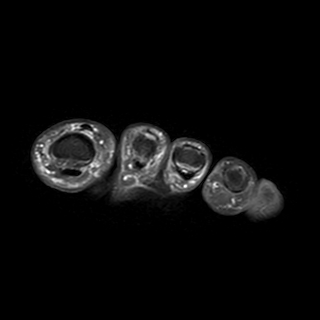
[im 20/40]
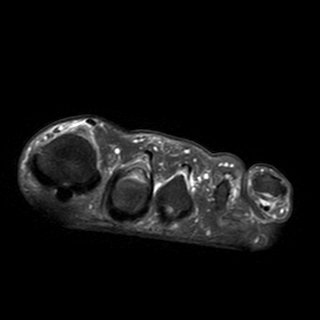
[im 27/40]
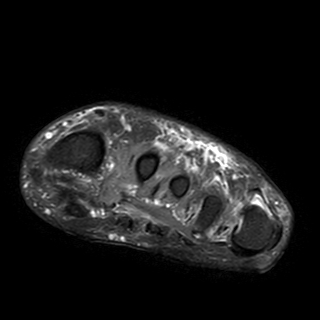
[im 33/40]
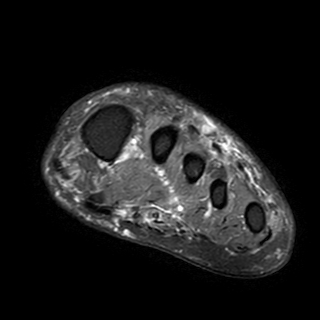
[im 40/40]
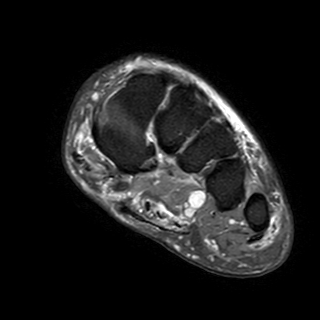

[Series 501: pd_fs_lax_foot · axial · 2.0mm · 0.31mm/px · z∈[-46,+8]mm · 5 of 28 slices shown]
[im 1/28]
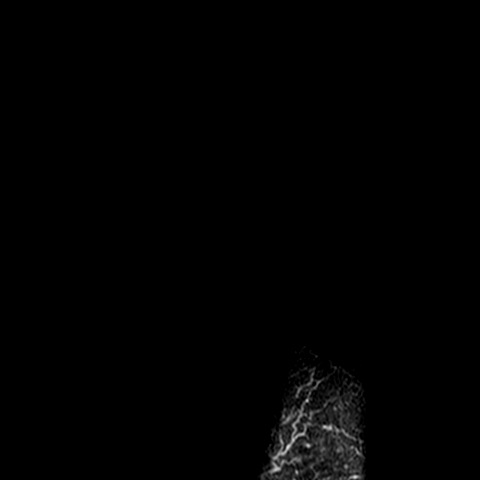
[im 7/28]
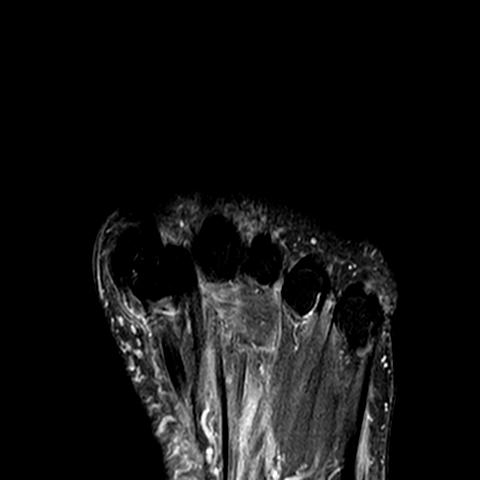
[im 14/28]
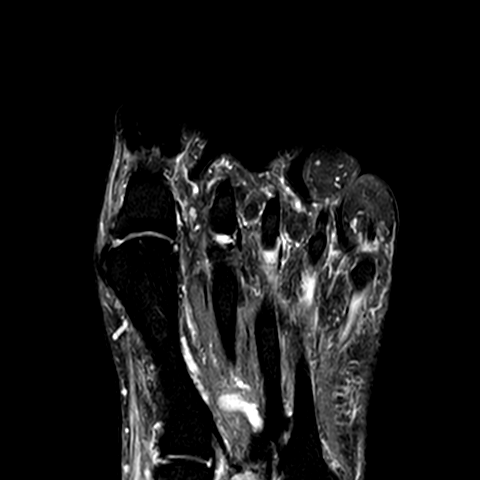
[im 21/28]
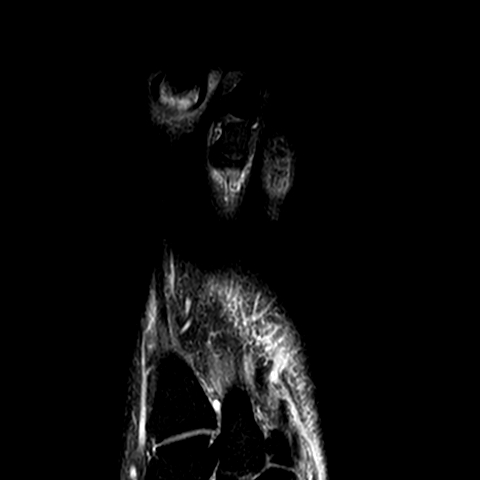
[im 28/28]
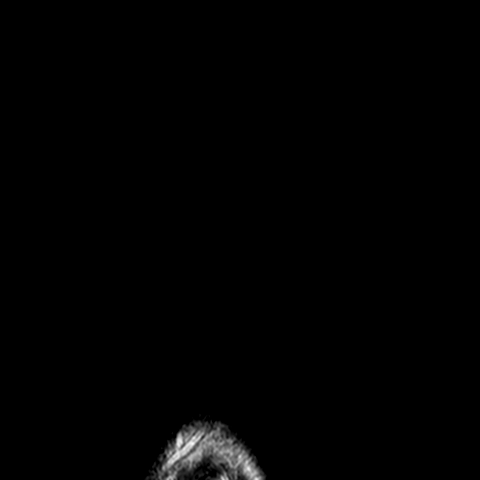

[Series 601: t1_sag_foot · sagittal · 2.5mm · 0.24mm/px · 3 of 35 slices shown]
[im 1/35]
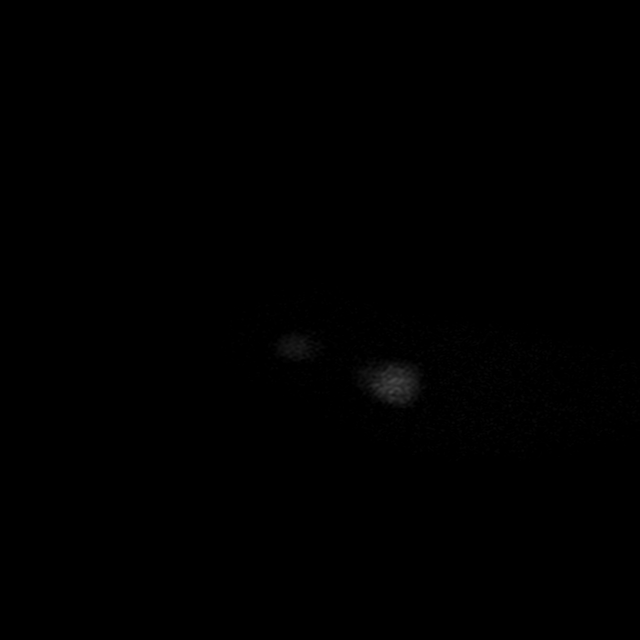
[im 7/35]
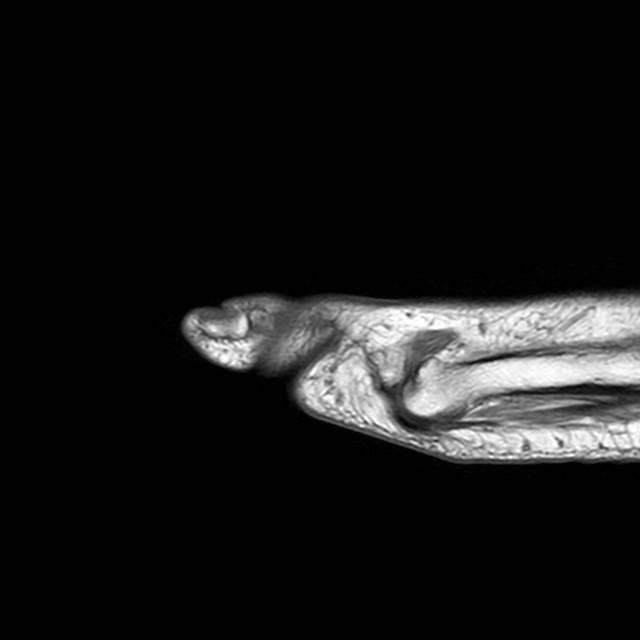
[im 14/35]
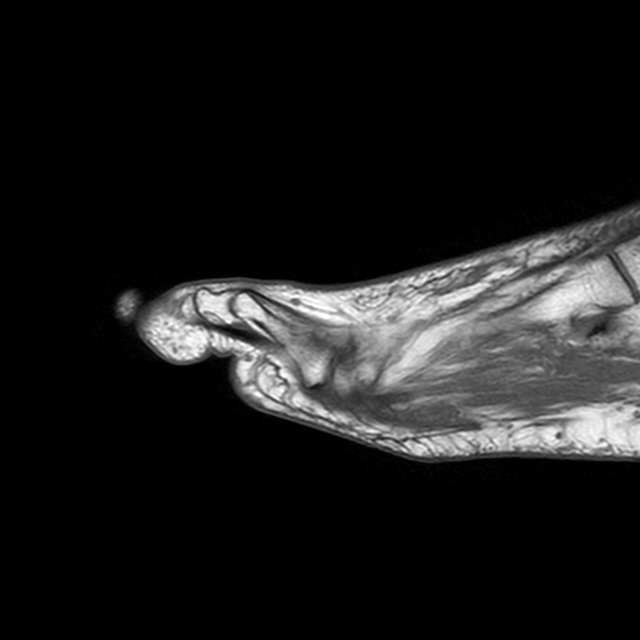

[26 of 40 positions shown; findings below may reference images not displayed]

FINDINGS: BONES/JOINTS: Normal bone marrow signal intensity. No osteomyelitis, fracture, 
contusion or stress response. Mild degenerative change of the hallux 
metatarsal-phalangeal (MTP) joint and bipartite tibial sesamoid. Congenital 
fusion of the fifth distal interphalangeal (DIP) joint.. Remaining joints are 
preserved. No plantar plate tear or capsulitis. 
SUBCUTANEOUS TISSUES: No ulceration. No abscess. 
MUSCLES: Marked abductor hallucis muscle atrophy and mild atrophy of the 
remaining muscles.  
TENDONS: The imaged portions of the flexor, extensor and peroneal tendons are 
intact. No tendon tear, tenosynovitis or tendinosis. No tendon subluxation.  
OTHER SOFT TISSUES: No interdigital neuroma or bursitis. Plantar fascia is 
intact. Mild dorsal subcutaneous soft tissue swelling.
IMPRESSION: 1.  Mild degenerative change of the hallux MTP joint and bipartite tibial 
sesamoid.  
2.  Marked abductor hallucis muscle atrophy and mild atrophy of the remaining 
muscles.  
3.  Mild soft tissue swelling.

## 2021-08-15 IMAGING — MR MRI RIGHT FOOT WITHOUT CONTRAST
5 of 7 series · 18 of 40 positions shown · IV contrast (gadolinium)
Comparison: None.

________________________________________________________________________________________________ 
MRI RIGHT FOOT WITHOUT CONTRAST, 08/15/2021 [DATE]: 
CLINICAL INDICATION: Right-sided peroneal tendinitis. Fifth metatarsal pain.
TECHNIQUE: Multiplanar, multiecho position MR images of the hind- and midfoot 
were performed without intravenous gadolinium enhancement. Patient was scanned 
on a 1.5T magnet.

[Series 101: survey_fullfov_transversal · axial · 10.0mm · 1.84mm/px · 1 of 7 slices shown]
[im 1/7]
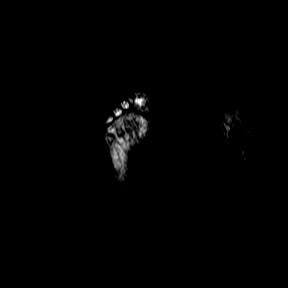

[Series 201: survey mst · axial · 10.0mm · 1.17mm/px · z∈[-20,+149]mm · 2 of 9 slices shown]
[im 1/9]
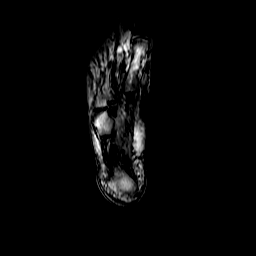
[im 9/9]
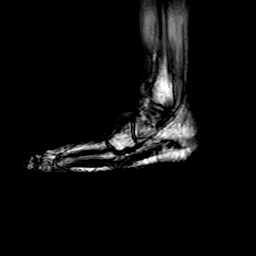

[Series 301: t1_sag_foot · sagittal · 2.5mm · 0.28mm/px · 6 of 30 slices shown]
[im 1/30]
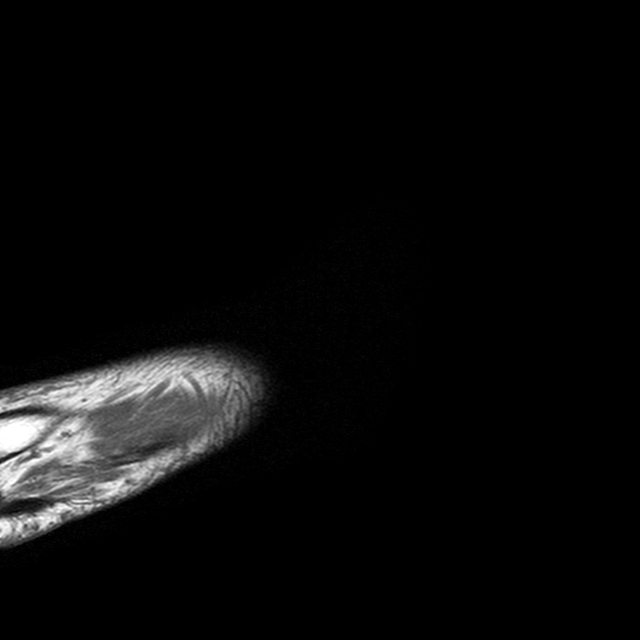
[im 6/30]
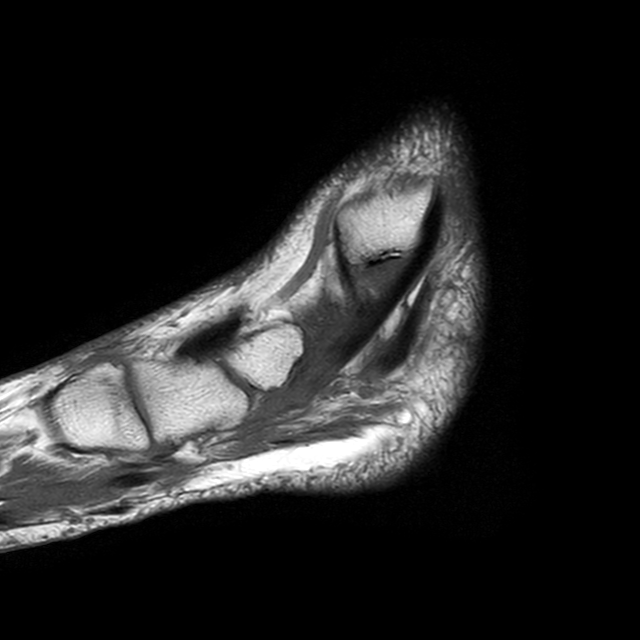
[im 12/30]
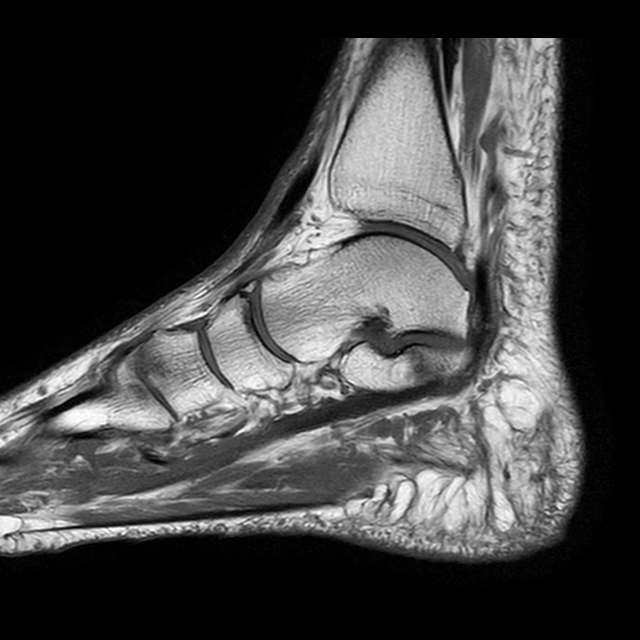
[im 18/30]
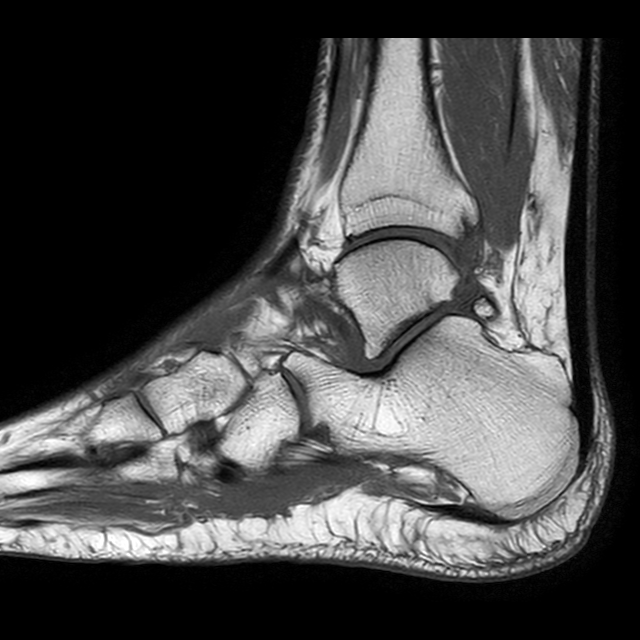
[im 24/30]
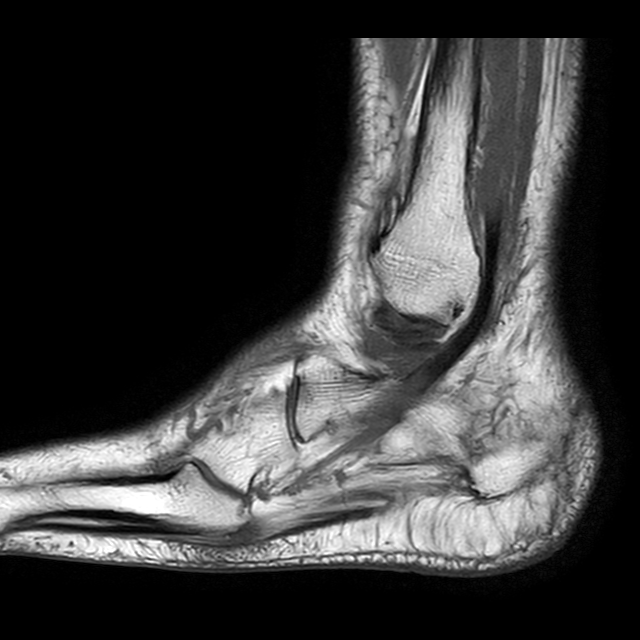
[im 30/30]
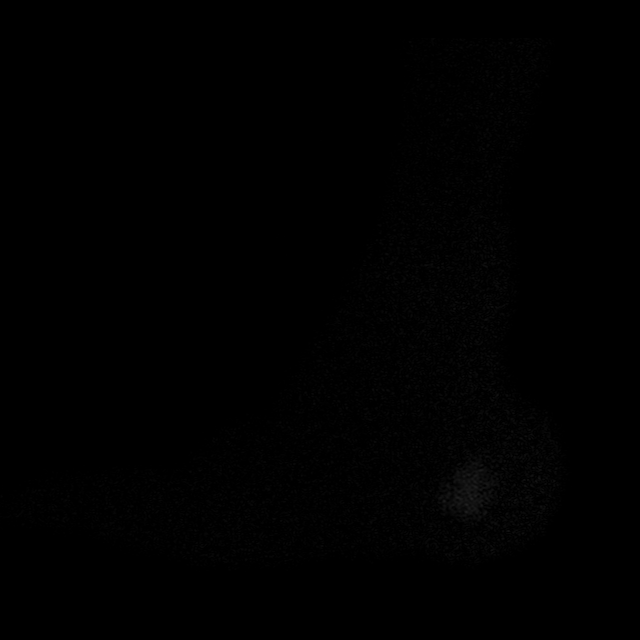

[Series 401: pd_fs_sag_foot · sagittal · 2.5mm · 0.34mm/px · 6 of 30 slices shown]
[im 1/30]
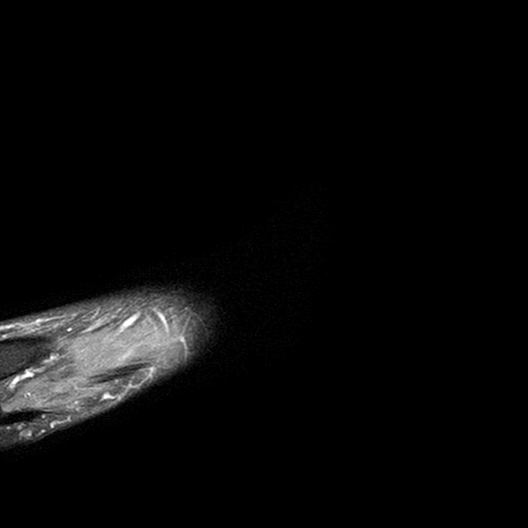
[im 6/30]
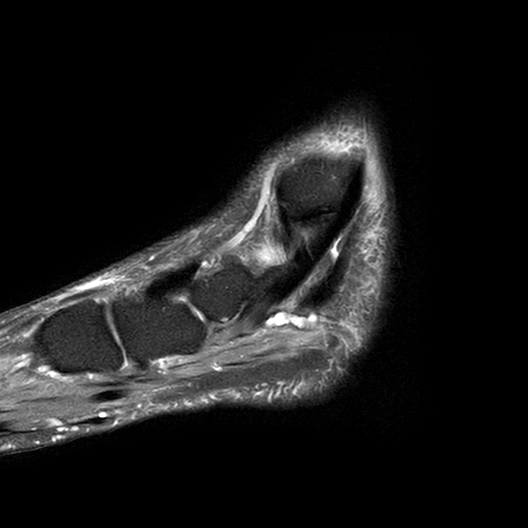
[im 12/30]
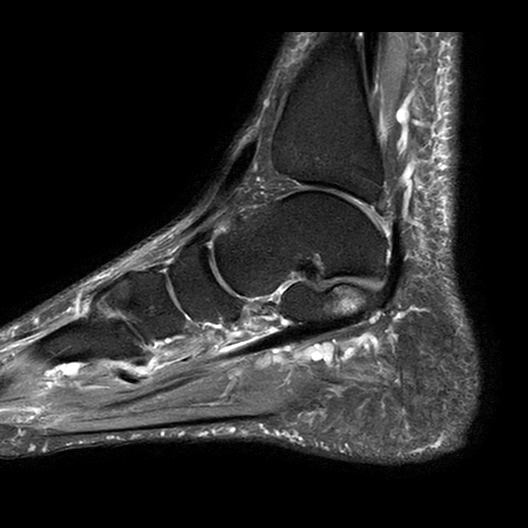
[im 18/30]
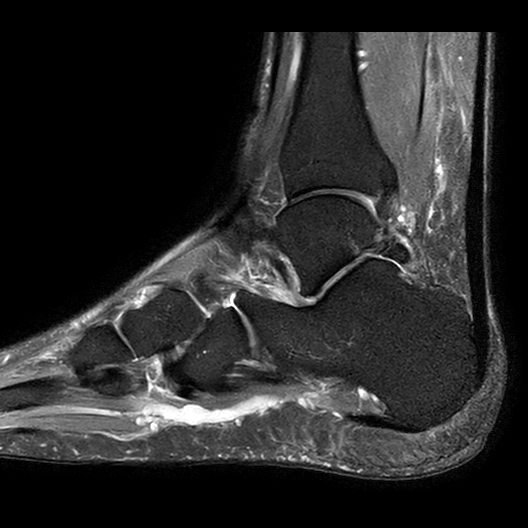
[im 24/30]
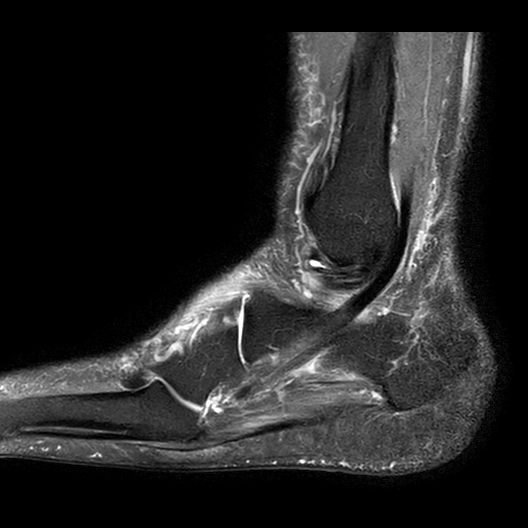
[im 30/30]
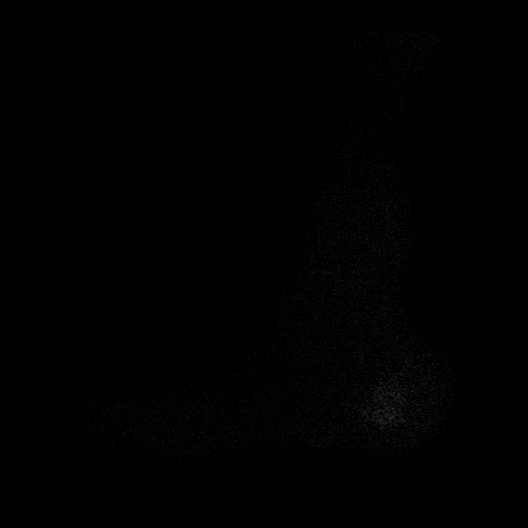

[Series 501: t1_sax_foot · coronal · 3.0mm · 0.27mm/px · 3 of 50 slices shown]
[im 1/50]
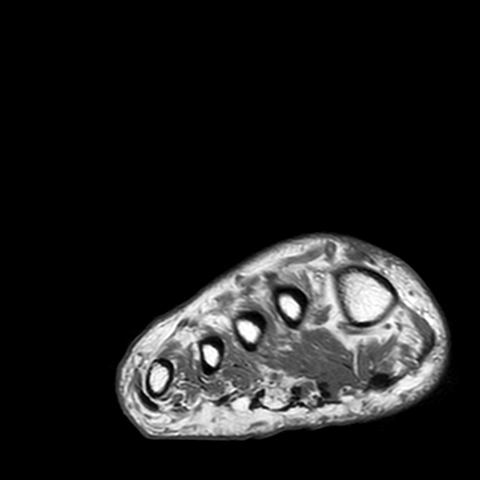
[im 7/50]
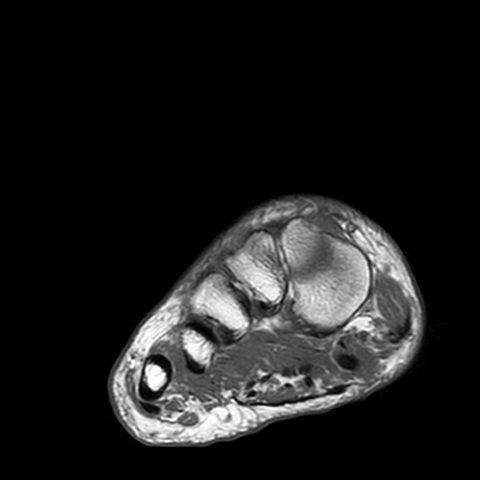
[im 13/50]
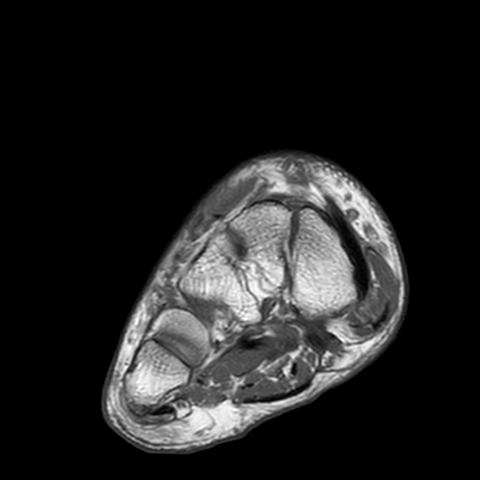

[18 of 40 positions shown; findings below may reference images not displayed]

FINDINGS: TENDONS: The flexor, extensor and peroneal tendons are intact. No tendon tear, 
tenosynovitis or tendinopathy. No tendon subluxation. The Achilles tendon is 
preserved. 
LIGAMENTS:  
LATERAL LIGAMENTS: The anterior talofibular ligament is intact. The 
calcaneofibular ligament and posterior talofibular ligament are preserved. 
SYNDESMOTIC LIGAMENTS: The anterior and posterior tibiofibular and interosseous 
ligaments are preserved. 
DELTOID LIGAMENTOUS COMPLEX: The deep and superficial components of the deltoid 
ligamentous complex are intact. 
SINUS TARSI LIGAMENTS: The cervical and interosseous ligaments are preserved. 
The inferior extensor retinaculum appears intact.  
BONES AND JOINTS: No fracture, contusion or stress response. The talar dome is 
preserved. No osteochondral lesion. Ankle joint is preserved. Tiny plantar 
calcaneal enthesophyte. Moderate degenerative change of the subtalar joint with 
full-thickness chondromalacia, subcortical cysts and medial superior calcaneal 
marrow edema. Midfoot joints are preserved.  
MUSCLES: Moderate abductor digiti minimi and mild abductor hallucis muscle fatty 
muscular atrophy. 
OTHER SOFT TISSUES: 0.5 cm ganglion medial to the distal Achilles tendon 
insertion. Tarsal tunnel is preserved. Sinus tarsi fat is preserved. Plantar 
fascia is intact. No ulceration. No abscess.
IMPRESSION: 1.  Normal peroneal tendons and 5th metatarsal. 
2.  Tiny plantar calcaneal enthesophyte.  
3.  Moderate degenerative change of the subtalar joint. 
4.  Moderate abductor digiti minimi and mild abductor hallucis muscle fatty 
muscular atrophy. 
5.  0.5 cm ganglion medial to the distal Achilles tendon insertion.
